# Patient Record
Sex: Male | Born: 1960 | Race: White | Hispanic: No | Marital: Married | Smoking: Former smoker
Health system: Southern US, Community
[De-identification: ages and names within clinical notes are randomized; demographics above are authoritative.]

## PROBLEM LIST (undated history)

## (undated) DIAGNOSIS — Z720 Tobacco use: Secondary | ICD-10-CM

---

## 2015-09-11 ENCOUNTER — Inpatient Hospital Stay (HOSPITAL_COMMUNITY)
Admission: AD | Admit: 2015-09-11 | Discharge: 2015-09-18 | DRG: 234 | Disposition: A | Discharge status: Home / Self Care | Payer: PRIVATE HEALTH INSURANCE | Source: Other Acute Inpatient Hospital | Attending: Cardiothoracic Surgery | Admitting: Cardiothoracic Surgery

## 2015-09-11 ENCOUNTER — Encounter (HOSPITAL_COMMUNITY)
Admission: AD | Disposition: A | Discharge status: Home / Self Care | Payer: Self-pay | Source: Other Acute Inpatient Hospital | Attending: Cardiothoracic Surgery

## 2015-09-11 ENCOUNTER — Other Ambulatory Visit: Payer: Self-pay

## 2015-09-11 ENCOUNTER — Encounter (HOSPITAL_COMMUNITY): Payer: Self-pay | Admitting: Student

## 2015-09-11 DIAGNOSIS — Z720 Tobacco use: Secondary | ICD-10-CM | POA: Diagnosis not present

## 2015-09-11 DIAGNOSIS — I214 Non-ST elevation (NSTEMI) myocardial infarction: Principal | ICD-10-CM | POA: Diagnosis present

## 2015-09-11 DIAGNOSIS — D62 Acute posthemorrhagic anemia: Secondary | ICD-10-CM | POA: Diagnosis not present

## 2015-09-11 DIAGNOSIS — Z8249 Family history of ischemic heart disease and other diseases of the circulatory system: Secondary | ICD-10-CM | POA: Diagnosis not present

## 2015-09-11 DIAGNOSIS — R7303 Prediabetes: Secondary | ICD-10-CM | POA: Diagnosis present

## 2015-09-11 DIAGNOSIS — Z7982 Long term (current) use of aspirin: Secondary | ICD-10-CM

## 2015-09-11 DIAGNOSIS — E8770 Fluid overload, unspecified: Secondary | ICD-10-CM | POA: Diagnosis not present

## 2015-09-11 DIAGNOSIS — I2510 Atherosclerotic heart disease of native coronary artery without angina pectoris: Secondary | ICD-10-CM

## 2015-09-11 DIAGNOSIS — J939 Pneumothorax, unspecified: Secondary | ICD-10-CM

## 2015-09-11 DIAGNOSIS — Z01810 Encounter for preprocedural cardiovascular examination: Secondary | ICD-10-CM

## 2015-09-11 DIAGNOSIS — Z09 Encounter for follow-up examination after completed treatment for conditions other than malignant neoplasm: Secondary | ICD-10-CM

## 2015-09-11 DIAGNOSIS — F17210 Nicotine dependence, cigarettes, uncomplicated: Secondary | ICD-10-CM | POA: Diagnosis present

## 2015-09-11 DIAGNOSIS — Z419 Encounter for procedure for purposes other than remedying health state, unspecified: Secondary | ICD-10-CM

## 2015-09-11 DIAGNOSIS — I25110 Atherosclerotic heart disease of native coronary artery with unstable angina pectoris: Secondary | ICD-10-CM

## 2015-09-11 DIAGNOSIS — Z951 Presence of aortocoronary bypass graft: Secondary | ICD-10-CM

## 2015-09-11 HISTORY — PX: CARDIAC CATHETERIZATION: SHX172

## 2015-09-11 HISTORY — DX: Tobacco use: Z72.0

## 2015-09-11 LAB — MRSA PCR SCREENING: MRSA by PCR: NEGATIVE

## 2015-09-11 LAB — TROPONIN I
TROPONIN I: 25.13 ng/mL — AB (ref <0.031)
TROPONIN I: 22.18 ng/mL — AB (ref <0.031)

## 2015-09-11 SURGERY — LEFT HEART CATH AND CORONARY ANGIOGRAPHY
Anesthesia: LOCAL

## 2015-09-11 MED ORDER — ATORVASTATIN CALCIUM 80 MG PO TABS
80.0000 mg | ORAL_TABLET | Freq: Every day | ORAL | Status: DC
  Administered 2015-09-11 – 2015-09-17 (×6): 80 mg via ORAL
  Filled 2015-09-11 (×6): qty 1

## 2015-09-11 MED ORDER — LIDOCAINE HCL (PF) 1 % IJ SOLN
INTRAMUSCULAR | Status: DC | PRN
  Administered 2015-09-11: 3 mL

## 2015-09-11 MED ORDER — IOPAMIDOL (ISOVUE-370) INJECTION 76%
INTRAVENOUS | Status: DC | PRN
  Administered 2015-09-11: 125 mL via INTRAVENOUS

## 2015-09-11 MED ORDER — VERAPAMIL HCL 2.5 MG/ML IV SOLN
INTRAVENOUS | Status: AC
  Filled 2015-09-11: qty 2

## 2015-09-11 MED ORDER — IOPAMIDOL (ISOVUE-370) INJECTION 76%
INTRAVENOUS | Status: AC
  Filled 2015-09-11: qty 100

## 2015-09-11 MED ORDER — HEPARIN SODIUM (PORCINE) 1000 UNIT/ML IJ SOLN
INTRAMUSCULAR | Status: DC | PRN
  Administered 2015-09-11: 4000 [IU] via INTRAVENOUS

## 2015-09-11 MED ORDER — ONDANSETRON HCL 4 MG/2ML IJ SOLN: 4.0000 mg | Freq: Four times a day (QID) | INTRAMUSCULAR | Status: DC | PRN

## 2015-09-11 MED ORDER — SODIUM CHLORIDE 0.9% FLUSH: 3.0000 mL | Freq: Two times a day (BID) | INTRAVENOUS | Status: DC

## 2015-09-11 MED ORDER — OXYCODONE-ACETAMINOPHEN 5-325 MG PO TABS: 1.0000 | ORAL_TABLET | ORAL | Status: DC | PRN

## 2015-09-11 MED ORDER — MIDAZOLAM HCL 2 MG/2ML IJ SOLN
INTRAMUSCULAR | Status: AC
  Filled 2015-09-11: qty 2

## 2015-09-11 MED ORDER — FENTANYL CITRATE (PF) 100 MCG/2ML IJ SOLN
INTRAMUSCULAR | Status: DC | PRN
  Administered 2015-09-11: 25 ug via INTRAVENOUS
  Administered 2015-09-11: 50 ug via INTRAVENOUS

## 2015-09-11 MED ORDER — NITROGLYCERIN 1 MG/10 ML FOR IR/CATH LAB
INTRA_ARTERIAL | Status: AC
  Filled 2015-09-11: qty 10

## 2015-09-11 MED ORDER — ACETAMINOPHEN 325 MG PO TABS: 650.0000 mg | ORAL_TABLET | ORAL | Status: DC | PRN

## 2015-09-11 MED ORDER — HEPARIN (PORCINE) IN NACL 100-0.45 UNIT/ML-% IJ SOLN
1140.0000 [IU]/h | INTRAMUSCULAR | Status: DC
  Administered 2015-09-11: 1150 [IU]/h via INTRAVENOUS

## 2015-09-11 MED ORDER — IOPAMIDOL (ISOVUE-370) INJECTION 76%
INTRAVENOUS | Status: AC
  Filled 2015-09-11: qty 50

## 2015-09-11 MED ORDER — ATORVASTATIN CALCIUM 80 MG PO TABS: 80.0000 mg | ORAL_TABLET | Freq: Every day | ORAL | Status: DC

## 2015-09-11 MED ORDER — HEPARIN SODIUM (PORCINE) 1000 UNIT/ML IJ SOLN
INTRAMUSCULAR | Status: AC
  Filled 2015-09-11: qty 1

## 2015-09-11 MED ORDER — SODIUM CHLORIDE 0.9% FLUSH: 3.0000 mL | INTRAVENOUS | Status: DC | PRN

## 2015-09-11 MED ORDER — FENTANYL CITRATE (PF) 100 MCG/2ML IJ SOLN
INTRAMUSCULAR | Status: AC
  Filled 2015-09-11: qty 2

## 2015-09-11 MED ORDER — NITROGLYCERIN 1 MG/10 ML FOR IR/CATH LAB
INTRA_ARTERIAL | Status: DC | PRN
  Administered 2015-09-11: 200 ug via INTRACORONARY

## 2015-09-11 MED ORDER — HEPARIN (PORCINE) IN NACL 100-0.45 UNIT/ML-% IJ SOLN
1300.0000 [IU]/h | INTRAMUSCULAR | Status: DC
  Administered 2015-09-12: 1050 [IU]/h via INTRAVENOUS
  Administered 2015-09-13: 1300 [IU]/h via INTRAVENOUS
  Filled 2015-09-11 (×3): qty 250

## 2015-09-11 MED ORDER — HEPARIN (PORCINE) IN NACL 2-0.9 UNIT/ML-% IJ SOLN
INTRAMUSCULAR | Status: DC | PRN
  Administered 2015-09-11: 1000 mL

## 2015-09-11 MED ORDER — SODIUM CHLORIDE 0.9 % IV SOLN
250.0000 mL | INTRAVENOUS | Status: DC | PRN
  Administered 2015-09-14: 12:00:00 via INTRAVENOUS

## 2015-09-11 MED ORDER — ASPIRIN 81 MG PO CHEW
81.0000 mg | CHEWABLE_TABLET | Freq: Every day | ORAL | Status: DC
  Administered 2015-09-11 – 2015-09-13 (×3): 81 mg via ORAL
  Filled 2015-09-11 (×3): qty 1

## 2015-09-11 MED ORDER — SODIUM CHLORIDE 0.9 % WEIGHT BASED INFUSION: 1.0000 mL/kg/h | INTRAVENOUS | Status: DC

## 2015-09-11 MED ORDER — LIDOCAINE HCL (PF) 1 % IJ SOLN
INTRAMUSCULAR | Status: AC
  Filled 2015-09-11: qty 30

## 2015-09-11 MED ORDER — SODIUM CHLORIDE 0.9 % WEIGHT BASED INFUSION
3.0000 mL/kg/h | INTRAVENOUS | Status: DC
  Administered 2015-09-11: 3 mL/kg/h via INTRAVENOUS

## 2015-09-11 MED ORDER — VERAPAMIL HCL 2.5 MG/ML IV SOLN
INTRAVENOUS | Status: DC | PRN
  Administered 2015-09-11: 16:00:00 via INTRA_ARTERIAL

## 2015-09-11 MED ORDER — SODIUM CHLORIDE 0.9 % IV SOLN: 250.0000 mL | INTRAVENOUS | Status: DC | PRN

## 2015-09-11 MED ORDER — HEPARIN (PORCINE) IN NACL 2-0.9 UNIT/ML-% IJ SOLN
INTRAMUSCULAR | Status: AC
  Filled 2015-09-11: qty 1000

## 2015-09-11 MED ORDER — NITROGLYCERIN 0.4 MG SL SUBL: 0.4000 mg | SUBLINGUAL_TABLET | SUBLINGUAL | Status: DC | PRN

## 2015-09-11 MED ORDER — ASPIRIN EC 81 MG PO TBEC: 81.0000 mg | DELAYED_RELEASE_TABLET | Freq: Every day | ORAL | Status: DC

## 2015-09-11 MED ORDER — SODIUM CHLORIDE 0.9 % WEIGHT BASED INFUSION
1.0000 mL/kg/h | INTRAVENOUS | Status: AC
  Administered 2015-09-11: 1 mL/kg/h via INTRAVENOUS

## 2015-09-11 MED ORDER — MIDAZOLAM HCL 2 MG/2ML IJ SOLN
INTRAMUSCULAR | Status: DC | PRN
  Administered 2015-09-11 (×2): 1 mg via INTRAVENOUS

## 2015-09-11 SURGICAL SUPPLY — 12 items
CATH INFINITI 5 FR JL3.5 (CATHETERS) ×2 IMPLANT
CATH INFINITI JR4 5F (CATHETERS) ×2 IMPLANT
CATH LAUNCHER 5F EBU3.5 (CATHETERS) ×2 IMPLANT
CATH LAUNCHER 5F JL3 (CATHETERS) ×1 IMPLANT
CATHETER LAUNCHER 5F JL3 (CATHETERS) ×2
DEVICE RAD COMP TR BAND LRG (VASCULAR PRODUCTS) ×2 IMPLANT
GLIDESHEATH SLEND A-KIT 6F 22G (SHEATH) ×2 IMPLANT
KIT HEART LEFT (KITS) ×2 IMPLANT
PACK CARDIAC CATHETERIZATION (CUSTOM PROCEDURE TRAY) ×2 IMPLANT
TRANSDUCER W/STOPCOCK (MISCELLANEOUS) ×2 IMPLANT
TUBING CIL FLEX 10 FLL-RA (TUBING) ×2 IMPLANT
WIRE SAFE-T 1.5MM-J .035X260CM (WIRE) ×2 IMPLANT

## 2015-09-11 NOTE — Progress Notes (Signed)
ANTICOAGULATION CONSULT NOTE - Follow Up Consult  Pharmacy Consult for Heparin Indication: chest pain/ACS and CAD  Not on File  Patient Measurements: Height: 5\' 6"  (167.6 cm) Weight: 146 lb (66.225 kg) IBW/kg (Calculated) : 63.8 Heparin Dosing Weight: 66.2 kg  Vital Signs: Temp: 99 F (37.2 C) (06/23 1740) Temp Source: Oral (06/23 1740) BP: 96/67 mmHg (06/23 1810) Pulse Rate: 55 (06/23 1810)  Labs:  Recent Labs  09/11/15 1333  TROPONINI 25.13*   Assessment:  s/p cardiac cath.  Heparin drip to resume 8 hrs after sheath out.  Sheath out ~5pm.  TR band deflation in process.  No bleeding or hematoma reported.  For TCTS consult.  Was on IV heparin pre-cath, but went to Cath Lab before first heparin level was scheduled to be drawn.  Goal of Therapy:  Heparin level 0.3-0.7 units/ml Monitor platelets by anticoagulation protocol: Yes   Plan:   Resume heparin drip at 1am on 6/24 at 1050 units/hr (~16 units/kg/hr)  Heparin level ~ 6hrs after drip resumes -> 7am on 6/24.  Daily heparin level and CBC while on heparin.  Dennie FettersEgan, Emslee Lopezmartinez Donovan, [place] Pager: [phone] 09/11/2015,7:10 PM

## 2015-09-11 NOTE — Progress Notes (Signed)
TCTS Please call wife with update/plan as she has to go home. Thank you

## 2015-09-11 NOTE — Consult Note (Signed)
[address]       [place] [address]             [phone]        AYOUB AREY [facility] Medical Record #[id number] Date of Birth: 19-Mar-1961  Referring: Dr Katrinka Blazing, admitted by Dr Swaziland Primary Care: none  Chief Complaint:  Chest Pain   History of Present Illness:     Adam Vega is a 55 y.o. male  who presented to [facility] for evaluation of chest pain and was found to have an elevated troponin of 1.63. Was transferred to [facility] for further evaluation.  Patient notes  intermittent episodes of chest discomfort for the past 2 weeks, lasting for several minutes at a time. He describes the pain as a pressure radiating across his chest and associated with mild dyspnea. No  nausea, vomiting, or diaphoresis.  Yesterday, he reported h passing out for several seconds. No prodromal symptoms noted.   He had recurrent chest pressure this AM and went to [facility] ER  . He has not been seen by a medical provider in years. Is unaware of his current medical diagnoses. Reports smoking 1.5 ppd for 20+ years. He also has a brother who required CABG in his 74's.   While at [facility], labs showed a troponin of 1.63. EKG showed sinus bradycardia with HR of 45 and inferior and lateral TWI. No prior tracings available for comparison. Labs showed a Na+ 137, K+ 4.2, creatinine 1.03, AST 122, ALT 35, INR 0.9. WBC elevated to 12.9, Hgb 14.0, platelets 245.   Current Activity/ Functional Status: Patient is independent with mobility/ambulation, transfers, ADL's, IADL's.   Zubrod Score: At the time of surgery this patient's most appropriate activity status/level should be described as:     0    Normal activity, no symptoms     1    Restricted in physical strenuous activity but ambulatory, able to do out light work     2    Ambulatory and capable of self care, unable to do work activities, up and about                 more than 50%  Of  the time                                3    Only limited self care, in bed greater than 50% of waking hours     4    Completely disabled, no self care, confined to bed or chair     5    Moribund  Past Medical History  Diagnosis Date  . Tobacco user     No past surgical history on file.  History  Smoking status  . Current Every Day Smoker -- 1.50 packs/day for 30 years  . Types: Cigarettes  Smokeless tobacco  . Not on file    History  Alcohol Use: does not drink Not on file    Social History   Social History  . Marital Status: Married    Spouse Name: N/A  . Number of Children: N/A  . Years of Education: N/A   Occupational History  . Plumbing business    Social History Main Topics  . Smoking status: Current Every Day Smoker -- 1.50 packs/day for 30 years    Types: Cigarettes  . Smokeless tobacco: Not on file  .  Alcohol Use: Not on file  . Drug Use: Not on file  . Sexual Activity: Not on file            Not on File  Current Facility-Administered Medications  Medication Dose Route Frequency Provider Last Rate Last Dose  . 0.9 %  sodium chloride infusion  250 mL Intravenous PRN Lyn RecordsHenry W Smith, MD      . 0.9% sodium chloride infusion  1 mL/kg/hr Intravenous Continuous Lyn RecordsHenry W Smith, MD 66.2 mL/hr at 09/11/15 1820 1 mL/kg/hr at 09/11/15 1820  . acetaminophen (TYLENOL) tablet 650 mg  650 mg Oral Q4H PRN Ellsworth LennoxBrittany M Strader, PA      . aspirin chewable tablet 81 mg  81 mg Oral Daily Lyn RecordsHenry W Smith, MD   81 mg at 09/11/15 1820  . atorvastatin (LIPITOR) tablet 80 mg  80 mg Oral q1800 Ellsworth LennoxBrittany M Strader, [place]   80 mg at 09/11/15 1819  . [START ON 09/12/2015] heparin ADULT infusion 100 units/mL (25000 units/24950mL sodium chloride 0.45%)  1,050 Units/hr Intravenous Continuous Scarlett Prestoheresa D Egan, RPH      . nitroGLYCERIN (NITROSTAT) SL tablet 0.4 mg  0.4 mg Sublingual Q5 Min x 3 PRN Ellsworth LennoxBrittany M Strader, [place]      . ondansetron [facility](ZOFRAN) injection 4 mg  4 mg Intravenous Q6H PRN  Ellsworth LennoxBrittany M Strader, [place]      . oxyCODONE-acetaminophen (PERCOCET/ROXICET) 5-325 MG per tablet 1-2 tablet  1-2 tablet Oral Q4H PRN Lyn RecordsHenry W Smith, MD      . sodium chloride flush (NS) 0.9 % injection 3 mL  3 mL Intravenous Q12H Lyn RecordsHenry W Smith, MD      . sodium chloride flush (NS) 0.9 % injection 3 mL  3 mL Intravenous PRN Lyn RecordsHenry W Smith, MD        No prescriptions prior to admission    Family History  Problem Relation Age of Onset  . Heart attack Brother     a. CABG in his 5650's   I did CABG on his brother 10 years ago   Review of Systems:      Cardiac Review of Systems: Y or N  Chest Pain [   y ]  Resting SOB [ n  ] Exertional SOB  [  y]  Orthopnea [  ]   Pedal Edema [   ]    Palpitations [  ] Syncope  [  ]   Presyncope [   ]  General Review of Systems: [Y] = yes [  ]=no Constitional: recent weight change [  ]; anorexia [  ]; fatigue [  ]; nausea [  ]; night sweats [  ]; fever [  ]; or chills [  ]                                                               Dental: poor dentition[  ]; Last Dentist visit:   Eye : blurred vision [  ]; diplopia [   ]; vision changes [  ];  Amaurosis fugax[  ]; Resp: cough [  ];  wheezing[  ];  hemoptysis[  ]; shortness of breath[  ]; paroxysmal nocturnal dyspnea[  ]; dyspnea on exertion[  ]; or orthopnea[  ];  GI:  gallstones[  ], vomiting[  ];  dysphagia[  ]; melena[  ];  hematochezia [  ]; heartburn[  ];   Hx of  Colonoscopy[  ]; GU: kidney stones [  ]; hematuria[  ];   dysuria [  ];  nocturia[  ];  history of     obstruction [  ]; urinary frequency [  ]             Skin: rash, swelling[  ];, hair loss[  ];  peripheral edema[  ];  or itching[  ]; Musculosketetal: myalgias[  ];  joint swelling[  ];  joint erythema[  ];  joint pain[  ];  back pain[  ];  Heme/Lymph: bruising[  ];  bleeding[  ];  anemia[  ];  Neuro: TIA[  ];  headaches[  ];  stroke[  ];  vertigo[  ];  seizures[  ];   paresthesias[  ];  difficulty walking[ n ];  Psych:depression[  ];  anxiety[  ];  Endocrine: diabetes[  ];  thyroid dysfunction[  ];  Immunizations: Flu [ n ]; Pneumococcal[n  ];  Other:  Physical Exam: BP 96/67 mmHg  Pulse 55  Temp(Src) 99 F (37.2 C) (Oral)  Resp 20  Ht 5\' 6"  (1.676 m)  Wt 146 lb (66.225 kg)  BMI 23.58 kg/m2  SpO2 97%   General appearance: alert, cooperative, appears stated age and no distress Head: Normocephalic, without obvious abnormality, atraumatic Neck: no adenopathy, no carotid bruit, no JVD, supple, symmetrical, trachea midline and thyroid not enlarged, symmetric, no tenderness/mass/nodules Lymph nodes: Cervical, supraclavicular, and axillary nodes normal. Resp: clear to auscultation bilaterally Back: symmetric, no curvature. ROM normal. No CVA tenderness. Cardio: regular rate and rhythm, S1, S2 normal, no murmur, click, rub or gallop GI: soft, non-tender; bowel sounds normal; no masses,  no organomegaly Extremities: extremities normal, atraumatic, no cyanosis or edema and Homans sign is negative, no sign of DVT Neurologic: Grossly normal Vein adequate for bypass   Diagnostic Studies & Laboratory data:     Recent Radiology Findings:   No results found.   I have independently reviewed the above radiologic studies.  Recent Lab Findings: No results found for: WBC, HGB, HCT, PLT, GLUCOSE, CHOL, TRIG, HDL, LDLDIRECT, LDLCALC, ALT, AST, NA, K, CL, CREATININE, BUN, CO2, TSH, INR, GLUF, HGBA1C  Lab Results  Component Value Date   TROPONINI 25.13* 09/11/2015    Cath: Coronary Findings    Dominance: Right   Left Anterior Descending  Dist LAD filled by collaterals from Lat Ramus.   . Prox LAD lesion, 100% stenosed.   . First Diagonal Branch   The vessel is small in size.   . First Septal Branch   The vessel is small in size.     Left Circumflex  . Vessel is small.     Right Coronary Artery   . Ost RCA to Prox RCA lesion, 100% stenosed.   . Dist RCA lesion, 100% stenosed.   . Inferior Septal   Inf Sept  filled by collaterals from 1st Sept.   . 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 The total time spent in the appointment was 60 minutes.    Delight Ovens MD      [address] [place] [place] [address] Office [phone]   Beeper [phone]  09/11/2015 8:05 PM

## 2015-09-11 NOTE — Progress Notes (Signed)
CRITICAL VALUE ALERT  Critical value received:  Troponin 25.13  Date of notification:  09/11/2015  Time of notification:  1420  Critical value read back:Yes.    Nurse who received alert:  Glade LloydMelissa Snow, RN  MD notified (1st page):  Randall AnBrittany Strader, [place]  Time of first page:  [phone]   Responding MD:  Randall AnBrittany Strader, PA  Time MD responded:  [phone]

## 2015-09-11 NOTE — Progress Notes (Signed)
Notified by lab of Troponin 25.13. EKG obtained.  Randall AnBrittany Strader, PA notified of critical troponin. Pt states that he has no active chest pain. Will continue to monitor closely, no new orders obtained.

## 2015-09-11 NOTE — Interval H&P Note (Signed)
Cath Lab Visit (complete for each Cath Lab visit)  Clinical Evaluation Leading to the Procedure:   ACS: Yes.    Non-ACS:    Anginal Classification: CCS III  Anti-ischemic medical therapy: Maximal Therapy (2 or more classes of medications)  Non-Invasive Test Results: No non-invasive testing performed  Prior CABG: No previous CABG      History and Physical Interval Note:  09/11/2015 4:16 PM  Adam Vega  has presented today for surgery, with the diagnosis of CP  The various methods of treatment have been discussed with the patient and family. After consideration of risks, benefits and other options for treatment, the patient has consented to  Procedure(s): Left Heart Cath and Coronary Angiography (N/A) as a surgical intervention .  The patient's history has been reviewed, patient examined, no change in status, stable for surgery.  I have reviewed the patient's chart and labs.  Questions were answered to the patient's satisfaction.     Lyn RecordsHenry W Smith III

## 2015-09-11 NOTE — H&P (Signed)
History & Physical    Patient ID: Adam Vega MRN: [id number], DOB/AGE: [birth year]   Admit date: 09/11/2015  Primary Physician: No primary care provider on file. Primary Cardiologist: New - [place]  History of Present Illness    Adam Vega is a 55 y.o. male with past medical history of tobacco abuse who presented [facility] for [facility] of chest pain and was found to have an elevated troponin of 1.63. [facility].  In talking with the patient, he reports intermittent episodes of chest discomfort for the past 2 weeks, lasting for several minutes at a time. He describes the pain as a pressure radiating across his chest and associated with mild dyspnea. Denies any nausea, vomiting, or diaphoresis.  Yesterday, he reported having a mild syncopal event and passing out for several seconds. No prodromal symptoms noted. He did check his BP later that day and noted his SBP was in the low-100's and HR was in the 50's, which he says is normal for him.  He developed recurrent chest pressure this AM and that prompted him to seek evaluation. He has not been seen by a medical provider in years. Is unaware of his current medical diagnoses. Reports smoking 1.5 ppd for 20+ years. He also has a brother who required CABG in his 7550's.   While [facility], labs showed a troponin of 1.63. EKG showed sinus bradycardia with HR of 45 and inferior and lateral TWI. No prior tracings available for comparison. Labs showed a Na+ 137, K+ 4.2, creatinine 1.03, AST 122, ALT 35, INR 0.9. WBC elevated to 12.9, Hgb 14.0, platelets 245.  Past Medical History    Past Medical History  Diagnosis Date  . Tobacco user     No past surgical history on file. - No Prior Procedures.  Allergies  Allergies not on file   Home Medications    Prior to Admission medications   Not on File    Family History    Family  History  Problem Relation Age of Onset  . Heart attack Brother     a. CABG in his 6650's    Social History    Social History   Social History  . Marital Status: Married    Spouse Name: N/A  . Number of Children: N/A  . Years of Education: N/A   Occupational History  . Not on file.   Social History Main Topics  . Smoking status: Current Every Day Smoker -- 1.50 packs/day for 30 years    Types: Cigarettes  . Smokeless tobacco: Not on file  . Alcohol Use: Not on file  . Drug Use: Not on file  . Sexual Activity: Not on file   Other Topics Concern  . Not on file   Social History Narrative  . No narrative on file     Review of Systems    General:  No chills, fever, night sweats or weight changes.  Cardiovascular:  No edema, orthopnea, palpitations, paroxysmal nocturnal dyspnea. Positive for chest pain and dyspnea on exertion. Dermatological: No rash, lesions/masses Respiratory: No cough, dyspnea Urologic: No hematuria, dysuria Abdominal:   No nausea, vomiting, diarrhea, bright red blood per rectum, melena, or hematemesis Neurologic:  No visual changes, wkns, changes in mental status. All other systems reviewed and are otherwise negative except as noted above.  Physical Exam    There were no vitals taken for this visit.  General: Well developed, well nourished,male in no acute distress. Head: Normocephalic, atraumatic, sclera non-icteric, no xanthomas, nares are without discharge. Dentition:  Neck: No carotid bruits. JVD not elevated.  Lungs: Respirations regular and unlabored, without wheezes or rales.  Heart: Regular rate and rhythm. No S3 or S4.  No murmur, no rubs, or gallops appreciated. Abdomen: Soft, non-tender, non-distended with normoactive bowel sounds. No hepatomegaly. No rebound/guarding. No obvious abdominal masses. Msk:  Strength and tone appear normal for age. No joint deformities or effusions. Extremities: No clubbing or cyanosis. No edema.  Distal pedal  pulses are 2+ bilaterally. Neuro: Alert and oriented X 3. Moves all extremities spontaneously. No focal deficits noted. Psych:  Responds to questions appropriately with a normal affect. Skin: No rashes or lesions noted  Labs    Labs at [facility]:   Troponin: 1.63.  Na+ 137 K+ 4.2 Creatinine 1.03 AST 122 ALT 35 INR 0.9 WBC 12.9 Hgb 14.0 Platelets 245  Other labs values are in his shadow chart.   Radiology Studies    No results found.  EKG & Cardiac Imaging    EKG: Sinus bradycardia with HR of 45 and inferior and lateral TWI. No prior tracings available for comparison.  ECHOCARDIOGRAM: None on File  Assessment & Plan    1. NSTEMI (non-ST elevated myocardial infarction) [facility](HCC) - presented [facility] for evaluation of chest pain, occurring for the past 2 weeks and worse with exertion. Noted a brief syncopal event yesterday. - no personal history of CAD, HTN, HLD, or Type 2 DM, but he has not seen a medical provider in 10+ years. Will check Lipid Panel and A1c for risk stratification. Known risks include his tobacco use and family history of CAD.  - initial troponin elevated to 1.63 and EKG showing inferior and lateral TWI. No prior tracings available for comparison. Will cycle troponin values. - recommend a cardiac catheterization. The patient understands that risks included but are not limited to stroke (1 in 1000), death (1 in 1000), kidney failure [usually temporary] (1 in 500), bleeding (1 in 200), allergic reaction [possibly serious] (1 in 200). Continue Heparin. Has been added to the catheterization schedule for later today. - start high-intensity statin. No BB currently secondary to bradycardia.  2. Tobacco abuse - cessation advised.   Signed, Ellsworth LennoxBrittany M Strader, PA-C 09/11/2015, 1:15 PM Pager: [phone]  Patient seen and examined and history reviewed. Agree with above findings and plan. 55 yo WM with history of tobacco abuse and family history  of premature CAD presents with 2 week history of chest pain. He had a near syncopal episode yesterday. No prior cardiac or medical history.  [facility] Ecg showed T wave inversion in the inferolateral leads. Troponin elevated at 1.6. Currently he is pain free. Exam is unremarkable. Impression:  NSTEMI. Start ASA and statin. Hold beta blocker due to bradycardia. Plan invasive evaluation with cardiac cath today. Strongly advise smoking cessation. Follow up lipid panel.  Peter SwazilandJordan, MDFACC 09/11/2015 1:33 PM

## 2015-09-11 NOTE — Progress Notes (Signed)
ANTICOAGULATION CONSULT NOTE - Initial Consult  Pharmacy Consult for Heparin Indication: chest pain/ACS  Not on File  Patient Measurements: IBW: 63.8 kg Heparin Dosing Weight: 63.5 kg  Vital Signs:    Labs: No results for input(s): HGB, HCT, PLT, APTT, LABPROT, INR, HEPARINUNFRC, HEPRLOWMOCWT, CREATININE, CKTOTAL, CKMB, TROPONINI in the last 72 hours.  CrCl cannot be calculated (Unknown ideal weight.).   Medical History: Past Medical History  Diagnosis Date  . Tobacco user     Medications:  F/u med rec. [facility]  Assessment: [age] y/o M [facility] with CP and elevated troponin. Yesterday, he reported having a mild syncopal event and passing out for several seconds at work. EKG showed sinus bradycardia with HR of 45 and inferior and lateral TWI  [facility] labs: Na+ 137, K+ 4.2, creatinine 1.03, AST 122, ALT 35, INR 0.9. WBC elevated to 12.9, Hgb 14.0, platelets 245.  PMH: tobacco use, no MD in 10+ years.+FH  premature CAD.  Anticoagulation: Start IV heparin for NSTEMI and plan cath. Baseline CBC WNL.  Goal of Therapy:  Heparin level 0.3-0.7 units/ml Monitor platelets by anticoagulation protocol: Yes   Plan:  Heparin 4000 unit [facility] bolus [facility] 0942 Heparin infusion 1140 units[facility]hr [facility] (18 units/kg/hr) Heparin level this afternoon. Daily HL and CBC  Commodore Bellew S. Merilynn Finlandobertson, PharmD, BCPS Clinical Staff Pharmacist Pager [phone]  Misty Stanleyobertson, Namir Neto Stillinger 09/11/2015,1:28 PM

## 2015-09-12 ENCOUNTER — Observation Stay (HOSPITAL_COMMUNITY): Payer: PRIVATE HEALTH INSURANCE

## 2015-09-12 DIAGNOSIS — Z8249 Family history of ischemic heart disease and other diseases of the circulatory system: Secondary | ICD-10-CM | POA: Diagnosis not present

## 2015-09-12 DIAGNOSIS — I214 Non-ST elevation (NSTEMI) myocardial infarction: Secondary | ICD-10-CM

## 2015-09-12 DIAGNOSIS — E8770 Fluid overload, unspecified: Secondary | ICD-10-CM | POA: Diagnosis not present

## 2015-09-12 DIAGNOSIS — F17210 Nicotine dependence, cigarettes, uncomplicated: Secondary | ICD-10-CM | POA: Diagnosis present

## 2015-09-12 DIAGNOSIS — I2510 Atherosclerotic heart disease of native coronary artery without angina pectoris: Secondary | ICD-10-CM | POA: Diagnosis present

## 2015-09-12 DIAGNOSIS — R7303 Prediabetes: Secondary | ICD-10-CM | POA: Diagnosis present

## 2015-09-12 DIAGNOSIS — Z7982 Long term (current) use of aspirin: Secondary | ICD-10-CM | POA: Diagnosis not present

## 2015-09-12 DIAGNOSIS — D62 Acute posthemorrhagic anemia: Secondary | ICD-10-CM | POA: Diagnosis not present

## 2015-09-12 DIAGNOSIS — I25110 Atherosclerotic heart disease of native coronary artery with unstable angina pectoris: Secondary | ICD-10-CM | POA: Diagnosis not present

## 2015-09-12 LAB — CBC
HCT: 37.6 % — ABNORMAL LOW (ref 39.0–52.0)
HEMOGLOBIN: 12.3 g/dL — AB (ref 13.0–17.0)
MCH: 28 pg (ref 26.0–34.0)
MCHC: 32.7 g/dL (ref 30.0–36.0)
MCV: 85.6 fL (ref 78.0–100.0)
Platelets: 183 10*3/uL (ref 150–400)
RBC: 4.39 MIL/uL (ref 4.22–5.81)
RDW: 13.7 % (ref 11.5–15.5)
WBC: 9.3 10*3/uL (ref 4.0–10.5)

## 2015-09-12 LAB — LIPID PANEL
CHOLESTEROL: 140 mg/dL (ref 0–200)
HDL: 28 mg/dL — AB (ref >40)
LDL Cholesterol: 86 mg/dL (ref 0–99)
TRIGLYCERIDES: 128 mg/dL (ref <150)
Total CHOL/HDL Ratio: 5 RATIO
VLDL: 26 mg/dL (ref 0–40)

## 2015-09-12 LAB — COMPREHENSIVE METABOLIC PANEL
ALK PHOS: 41 U/L (ref 38–126)
ALT: 35 U/L (ref 17–63)
ANION GAP: 5 (ref 5–15)
AST: 107 U/L — ABNORMAL HIGH (ref 15–41)
Albumin: 3.3 g/dL — ABNORMAL LOW (ref 3.5–5.0)
BILIRUBIN TOTAL: 0.8 mg/dL (ref 0.3–1.2)
BUN: 8 mg/dL (ref 6–20)
CALCIUM: 8.8 mg/dL — AB (ref 8.9–10.3)
CO2: 26 mmol/L (ref 22–32)
Chloride: 105 mmol/L (ref 101–111)
Creatinine, Ser: 0.97 mg/dL (ref 0.61–1.24)
Glucose, Bld: 101 mg/dL — ABNORMAL HIGH (ref 65–99)
Potassium: 3.9 mmol/L (ref 3.5–5.1)
SODIUM: 136 mmol/L (ref 135–145)
TOTAL PROTEIN: 5.3 g/dL — AB (ref 6.5–8.1)

## 2015-09-12 LAB — HEPARIN LEVEL (UNFRACTIONATED)
Heparin Unfractionated: 0.22 IU/mL — ABNORMAL LOW (ref 0.30–0.70)
Heparin Unfractionated: 0.46 IU/mL (ref 0.30–0.70)
Heparin Unfractionated: 0.29 IU/mL — ABNORMAL LOW (ref 0.30–0.70)

## 2015-09-12 LAB — TROPONIN I: Troponin I: 22.73 ng/mL (ref <0.031)

## 2015-09-12 MED ORDER — TRAZODONE HCL 50 MG PO TABS
25.0000 mg | ORAL_TABLET | Freq: Once | ORAL | Status: AC
  Administered 2015-09-12: 25 mg via ORAL
  Filled 2015-09-12: qty 1

## 2015-09-12 MED ORDER — NICOTINE 14 MG/24HR TD PT24
14.0000 mg | MEDICATED_PATCH | TRANSDERMAL | Status: DC
  Administered 2015-09-12 – 2015-09-13 (×2): 14 mg via TRANSDERMAL
  Filled 2015-09-12 (×2): qty 1

## 2015-09-12 NOTE — Progress Notes (Signed)
Pre-op Cardiac Surgery  Carotid Findings:  1-39% ICA plaquing.  Vertebral artery flow is antegrade.   Upper Extremity Right Left  Brachial Pressures 108T 104T  Radial Waveforms T T  Ulnar Waveforms T T  Palmar Arch (Allen's Test) Doppler signal remains normal with radial compression and obliterate with ulnar WNL   Findings:      Lower  Extremity Right Left  Dorsalis Pedis    Anterior Tibial T T  Posterior Tibial T T  Ankle/Brachial Indices      Findings:

## 2015-09-12 NOTE — Progress Notes (Signed)
ANTICOAGULATION CONSULT NOTE - Follow Up Consult  Pharmacy Consult for Heparin Indication: chest pain/ACS and CAD  Not on File  Patient Measurements: Height: 5\' 6"  (167.6 cm) Weight: 146 lb (66.225 kg) IBW/kg (Calculated) : 63.8 Heparin Dosing Weight: 66.2 kg  Vital Signs: Temp: 98.2 F (36.8 C) (06/24 0816) Temp Source: Oral (06/24 0816) BP: 89/58 mmHg (06/24 0500) Pulse Rate: 54 (06/24 0816)  Labs:  Recent Labs  09/11/15 1333 09/11/15 1926 09/12/15 0117 09/12/15 0656  HGB  --   --  12.3*  --   HCT  --   --  37.6*  --   PLT  --   --  183  --   HEPARINUNFRC  --   --   --  0.22*  CREATININE  --   --  0.97  --   TROPONINI 25.13* 22.18* 22.73*  --    Assessment: Heparin for NSTEMI resumed post-cath 6/24 0120. 6hr HL subtherapeutic at 0.22 on 1050 unit/hr. H/H 12.3/37.6, plt 183. No overt bleeding noted.   Goal of Therapy:  Heparin level 0.3-0.7 units/ml Monitor platelets by anticoagulation protocol: Yes   Plan:  -Increase heparin to 1200 units/hr -6hr HL -Daily HL, CBC -Monitor s/sx bleeding  Sherle Poeob Benjerman Molinelli, PharmD Clinical Pharmacy Resident 8:24 AM, 09/12/2015

## 2015-09-12 NOTE — Progress Notes (Signed)
ANTICOAGULATION CONSULT NOTE - Follow Up Consult  Pharmacy Consult for Heparin Indication: chest pain/ACS and CAD  Allergies  Allergen Reactions  . Amoxicillin Other (See Comments)    Patient just stated that he got very sick .   Marland Kitchen. Penicillins Other (See Comments)    Patient just stated that he got very sick    Patient Measurements: Height: 5\' 6"  (167.6 cm) Weight: 146 lb (66.225 kg) IBW/kg (Calculated) : 63.8 Heparin Dosing Weight: 66.2 kg  Vital Signs: Temp: 98.7 F (37.1 C) (06/24 1342) Temp Source: Oral (06/24 1342) BP: 95/53 mmHg (06/24 1342) Pulse Rate: 59 (06/24 1342)  Labs:  Recent Labs  09/11/15 1333 09/11/15 1926 09/12/15 0117 09/12/15 0656 09/12/15 1441 09/12/15 2007  HGB  --   --  12.3*  --   --   --   HCT  --   --  37.6*  --   --   --   PLT  --   --  183  --   --   --   HEPARINUNFRC  --   --   --  0.22* 0.46 0.29*  CREATININE  --   --  0.97  --   --   --   TROPONINI 25.13* 22.18* 22.73*  --   --   --    Assessment: Heparin for NSTEMI resumed post-cath 6/24 0120. H/H 12.3/37.6, plt 183. No overt bleeding noted.   Heparin level now slightly sub therapeutic at 0.29 after being therapeutic earlier today. No issues with infusion or sxs of bleeding.   Goal of Therapy:  Heparin level 0.3-0.7 units/ml Monitor platelets by anticoagulation protocol: Yes   Plan:  - Increase heparin to 1300 units/hr - HL in AM as already ordered.   Pollyann SamplesAndy Audrionna Lampton, PharmD, BCPS 09/12/2015, 8:51 PM Pager: [phone]

## 2015-09-12 NOTE — Progress Notes (Signed)
    Subjective:  Denies SSCP, palpitations or Dyspnea For CABG Monday  Objective:  Filed Vitals:   09/11/15 2225 09/11/15 2325 09/12/15 0026 09/12/15 0500  BP: 107/71 92/62 101/70 89/58  Pulse: 60 52 57 66  Temp:   98.9 F (37.2 C) 99.3 F (37.4 C)  TempSrc:      Resp: 22 24 20 19   Height:      Weight:      SpO2: 94% 95% 98% 94%    Intake/Output from previous day:  Intake/Output Summary (Last 24 hours) at 09/12/15 [id number] Last data filed at 09/12/15 0700  Gross per 24 hour  Intake 1229.62 ml  Output   1525 ml  Net -295.38 ml    Physical Exam: Affect appropriate Healthy:  appears stated age HEENT: normal Neck supple with no adenopathy JVP normal no bruits no thyromegaly Lungs clear with no wheezing and good diaphragmatic motion Heart:  S1/S2 no murmur, no rub, gallop or click PMI normal Abdomen: benighn, BS positve, no tenderness, no AAA no bruit.  No HSM or HJR Distal pulses intact with no bruits No edema Neuro non-focal Skin warm and dry No muscular weakness   Lab Results: Basic Metabolic Panel:  Recent Labs  11/91/4706/24/17 0117  NA 136  K 3.9  CL 105  CO2 26  GLUCOSE 101*  BUN 8  CREATININE 0.97  CALCIUM 8.8*   Liver Function Tests:  Recent Labs  09/12/15 0117  AST 107*  ALT 35  ALKPHOS 41  BILITOT 0.8  PROT 5.3*  ALBUMIN 3.3*   CBC:  Recent Labs  09/12/15 0117  WBC 9.3  HGB 12.3*  HCT 37.6*  MCV 85.6  PLT 183   Cardiac Enzymes:  Recent Labs  09/11/15 1333 09/11/15 1926 09/12/15 0117  TROPONINI 25.13* 22.18* 22.73*   Fasting Lipid Panel:  Recent Labs  09/12/15 0117  CHOL 140  HDL 28*  LDLCALC 86  TRIG 829128  CHOLHDL 5.0    Imaging: No results found.  Cardiac Studies:  ECG:  SR inferolateral T wave inversions    Telemetry:  NSR 09/12/2015   Echo:   Medications:   . aspirin  81 mg Oral Daily  . atorvastatin  80 mg Oral q1800  . sodium chloride flush  3 mL Intravenous Q12H     . heparin 1,050 Units/hr  (09/12/15 0118)    Assessment/Plan:  55 y.o. severe CAD Cath reviewed:  Total occlusion of the proximal RCA. RCA is dominant giving 3 large inferior wall vessels taken up the territory of the distal circumflex. Left right collaterals are noted.  Total occlusion of the proximal to mid LAD beyond the origin of the first elbow perforator. Left left collaterals are noted.  Circumflex is relatively small and widely patent..   The large branching ramus intermedius is widely patent and supplies many of the collaterals to both RCA and LAD.  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 LVEDP is mildly elevated.  Enzymes over 22  Continue heparin for CABG with Dr Tyrone SageGerhardt Monday BP soft not on ACE hopefully EF will improve post revascularization Will need f/u EF 3 months consider AICD  Adam Vega Adam Vega 09/12/2015, 8:11 AM

## 2015-09-12 NOTE — Progress Notes (Signed)
ANTICOAGULATION CONSULT NOTE - Follow Up Consult  Pharmacy Consult for Heparin Indication: chest pain/ACS and CAD  Allergies  Allergen Reactions  . Amoxicillin Other (See Comments)    Patient just stated that he got very sick .   Marland Kitchen. Penicillins Other (See Comments)    Patient just stated that he got very sick    Patient Measurements: Height: 5\' 6"  (167.6 cm) Weight: 146 lb (66.225 kg) IBW/kg (Calculated) : 63.8 Heparin Dosing Weight: 66.2 kg  Vital Signs: Temp: 98.7 F (37.1 C) (06/24 1342) Temp Source: Oral (06/24 1342) BP: 95/53 mmHg (06/24 1342) Pulse Rate: 59 (06/24 1342)  Labs:  Recent Labs  09/11/15 1333 09/11/15 1926 09/12/15 0117 09/12/15 0656 09/12/15 1441  HGB  --   --  12.3*  --   --   HCT  --   --  37.6*  --   --   PLT  --   --  183  --   --   HEPARINUNFRC  --   --   --  0.22* 0.46  CREATININE  --   --  0.97  --   --   TROPONINI 25.13* 22.18* 22.73*  --   --    Assessment: Heparin for NSTEMI resumed post-cath 6/24 0120. H/H 12.3/37.6, plt 183. No overt bleeding noted.   Heparin level now therapeutic @ 0.46. Continue current rate  Goal of Therapy:  Heparin level 0.3-0.7 units/ml Monitor platelets by anticoagulation protocol: Yes   Plan:  -Contine heparin at 1200 units/hr -6hr cHL -Daily HL, CBC -Monitor s/sx bleeding  Eulice Rutledge C. Marvis MoellerMiles, PharmD Pharmacy Resident  Pager: [phone] 09/12/2015 3:49 PM

## 2015-09-13 ENCOUNTER — Inpatient Hospital Stay (HOSPITAL_COMMUNITY): Payer: PRIVATE HEALTH INSURANCE

## 2015-09-13 LAB — BLOOD GAS, ARTERIAL
Acid-Base Excess: 0.7 mmol/L (ref 0.0–2.0)
Bicarbonate: 24.5 mEq/L — ABNORMAL HIGH (ref 20.0–24.0)
Drawn by: 283401
FIO2: 0.21
O2 Saturation: 94.2 %
Patient temperature: 98.6
TCO2: 25.6 mmol/L (ref 0–100)
pCO2 arterial: 37.1 mmHg (ref 35.0–45.0)
pH, Arterial: 7.435 (ref 7.350–7.450)
pO2, Arterial: 72.6 mmHg — ABNORMAL LOW (ref 80.0–100.0)

## 2015-09-13 LAB — CBC
HEMATOCRIT: 40.1 % (ref 39.0–52.0)
HEMOGLOBIN: 12.9 g/dL — AB (ref 13.0–17.0)
MCH: 27.7 pg (ref 26.0–34.0)
MCHC: 32.2 g/dL (ref 30.0–36.0)
MCV: 86.2 fL (ref 78.0–100.0)
PLATELETS: 206 10*3/uL (ref 150–400)
RBC: 4.65 MIL/uL (ref 4.22–5.81)
RDW: 13.7 % (ref 11.5–15.5)
WBC: 11.6 10*3/uL — ABNORMAL HIGH (ref 4.0–10.5)

## 2015-09-13 LAB — URINALYSIS, ROUTINE W REFLEX MICROSCOPIC
Bilirubin Urine: NEGATIVE
Glucose, UA: NEGATIVE mg/dL
Hgb urine dipstick: NEGATIVE
Ketones, ur: NEGATIVE mg/dL
Leukocytes, UA: NEGATIVE
Nitrite: NEGATIVE
Protein, ur: NEGATIVE mg/dL
Specific Gravity, Urine: 1.012 (ref 1.005–1.030)
pH: 7.5 (ref 5.0–8.0)

## 2015-09-13 LAB — TYPE AND SCREEN
ABO/RH(D): O POS
Antibody Screen: NEGATIVE

## 2015-09-13 LAB — ABO/RH: ABO/RH(D): O POS

## 2015-09-13 LAB — PROTIME-INR
INR: 1.1 (ref 0.00–1.49)
Prothrombin Time: 14.4 seconds (ref 11.6–15.2)

## 2015-09-13 LAB — APTT: aPTT: 80 seconds — ABNORMAL HIGH (ref 24–37)

## 2015-09-13 LAB — SURGICAL PCR SCREEN
MRSA, PCR: NEGATIVE
Staphylococcus aureus: NEGATIVE

## 2015-09-13 LAB — HEPARIN LEVEL (UNFRACTIONATED): Heparin Unfractionated: 0.49 IU/mL (ref 0.30–0.70)

## 2015-09-13 MED ORDER — POTASSIUM CHLORIDE 2 MEQ/ML IV SOLN
80.0000 meq | INTRAVENOUS | Status: DC
  Filled 2015-09-13: qty 40

## 2015-09-13 MED ORDER — TEMAZEPAM 15 MG PO CAPS
15.0000 mg | ORAL_CAPSULE | Freq: Once | ORAL | Status: AC | PRN
  Administered 2015-09-13: 15 mg via ORAL
  Filled 2015-09-13: qty 1

## 2015-09-13 MED ORDER — PLASMA-LYTE 148 IV SOLN
INTRAVENOUS | Status: AC
  Administered 2015-09-14: 500 mL
  Filled 2015-09-13: qty 2.5

## 2015-09-13 MED ORDER — EPINEPHRINE HCL 1 MG/ML IJ SOLN
0.0000 ug/min | INTRAVENOUS | Status: DC
  Filled 2015-09-13: qty 4

## 2015-09-13 MED ORDER — SODIUM CHLORIDE 0.9 % IV SOLN
INTRAVENOUS | Status: DC
  Filled 2015-09-13: qty 30

## 2015-09-13 MED ORDER — SODIUM CHLORIDE 0.9 % IV SOLN
INTRAVENOUS | Status: DC
  Filled 2015-09-13: qty 2.5

## 2015-09-13 MED ORDER — MAGNESIUM SULFATE 50 % IJ SOLN
40.0000 meq | INTRAMUSCULAR | Status: DC
  Filled 2015-09-13: qty 10

## 2015-09-13 MED ORDER — DEXMEDETOMIDINE HCL IN NACL 400 MCG/100ML IV SOLN
0.1000 ug/kg/h | INTRAVENOUS | Status: AC
  Administered 2015-09-14: 0.7 ug/kg/h via INTRAVENOUS
  Filled 2015-09-13: qty 100

## 2015-09-13 MED ORDER — DOPAMINE-DEXTROSE 3.2-5 MG/ML-% IV SOLN
0.0000 ug/kg/min | INTRAVENOUS | Status: AC
  Administered 2015-09-14: 3 ug/kg/min via INTRAVENOUS
  Filled 2015-09-13: qty 250

## 2015-09-13 MED ORDER — CHLORHEXIDINE GLUCONATE 4 % EX LIQD
60.0000 mL | Freq: Once | CUTANEOUS | Status: AC
  Administered 2015-09-13: 4 via TOPICAL
  Filled 2015-09-13: qty 60

## 2015-09-13 MED ORDER — CHLORHEXIDINE GLUCONATE 4 % EX LIQD
60.0000 mL | Freq: Once | CUTANEOUS | Status: AC
  Administered 2015-09-14: 4 via TOPICAL
  Filled 2015-09-13: qty 60

## 2015-09-13 MED ORDER — BISACODYL 5 MG PO TBEC
5.0000 mg | DELAYED_RELEASE_TABLET | Freq: Once | ORAL | Status: DC
  Filled 2015-09-13: qty 1

## 2015-09-13 MED ORDER — VANCOMYCIN HCL 10 G IV SOLR
1250.0000 mg | INTRAVENOUS | Status: AC
  Administered 2015-09-14: 1250 mg via INTRAVENOUS
  Filled 2015-09-13: qty 1250

## 2015-09-13 MED ORDER — NITROGLYCERIN IN D5W 200-5 MCG/ML-% IV SOLN
2.0000 ug/min | INTRAVENOUS | Status: AC
  Administered 2015-09-14: 15 ug/min via INTRAVENOUS
  Filled 2015-09-13: qty 250

## 2015-09-13 MED ORDER — SODIUM CHLORIDE 0.9 % IV SOLN
INTRAVENOUS | Status: AC
  Administered 2015-09-14: 69.8 mL/h via INTRAVENOUS
  Filled 2015-09-13: qty 40

## 2015-09-13 MED ORDER — PHENYLEPHRINE HCL 10 MG/ML IJ SOLN
30.0000 ug/min | INTRAVENOUS | Status: AC
  Administered 2015-09-14: 50 ug/min via INTRAVENOUS
  Filled 2015-09-13: qty 2

## 2015-09-13 MED ORDER — LEVOFLOXACIN IN D5W 500 MG/100ML IV SOLN
500.0000 mg | INTRAVENOUS | Status: DC
  Filled 2015-09-13: qty 100

## 2015-09-13 MED ORDER — METOPROLOL TARTRATE 12.5 MG HALF TABLET
12.5000 mg | ORAL_TABLET | Freq: Once | ORAL | Status: AC
  Administered 2015-09-14: 12.5 mg via ORAL
  Filled 2015-09-13: qty 1

## 2015-09-13 MED ORDER — CHLORHEXIDINE GLUCONATE 0.12 % MT SOLN
15.0000 mL | Freq: Once | OROMUCOSAL | Status: AC
  Administered 2015-09-14: 15 mL via OROMUCOSAL
  Filled 2015-09-13: qty 15

## 2015-09-13 NOTE — Progress Notes (Signed)
Patient ID: Virl SonRichard K Anton, male   DOB: November 30, 1960, 55 y.o.   MRN: [id number]    Subjective:  Denies SSCP, palpitations or Dyspnea For CABG Monday  Objective:  Filed Vitals:   09/12/15 0816 09/12/15 1342 09/12/15 2050 09/13/15 0500  BP: 110/70 95/53 102/59 106/67  Pulse: 54 59 60 69  Temp: 98.2 F (36.8 C) 98.7 F (37.1 C) 98.2 F (36.8 C) 99.4 F (37.4 C)  TempSrc: Oral Oral    Resp: 20 15 16 15   Height:      Weight:    143 lb (64.864 kg)  SpO2: 96% 96% 96% 95%    Intake/Output from previous day:  Intake/Output Summary (Last 24 hours) at 09/13/15 0845 Last data filed at 09/13/15 [id number]  Gross per 24 hour  Intake 1392.4 ml  Output   1740 ml  Net -347.6 ml    Physical Exam: Affect appropriate Healthy:  appears stated age HEENT: normal Neck supple with no adenopathy JVP normal no bruits no thyromegaly Lungs clear with no wheezing and good diaphragmatic motion Heart:  S1/S2 no murmur, no rub, gallop or click PMI normal Abdomen: benighn, BS positve, no tenderness, no AAA no bruit.  No HSM or HJR Distal pulses intact with no bruits No edema Neuro non-focal Skin warm and dry No muscular weakness   Lab Results: Basic Metabolic Panel:  Recent Labs  30/86/5706/24/17 0117  NA 136  K 3.9  CL 105  CO2 26  GLUCOSE 101*  BUN 8  CREATININE 0.97  CALCIUM 8.8*   Liver Function Tests:  Recent Labs  09/12/15 0117  AST 107*  ALT 35  ALKPHOS 41  BILITOT 0.8  PROT 5.3*  ALBUMIN 3.3*   CBC:  Recent Labs  09/12/15 0117 09/13/15 0324  WBC 9.3 11.6*  HGB 12.3* 12.9*  HCT 37.6* 40.1  MCV 85.6 86.2  PLT 183 206   Cardiac Enzymes:  Recent Labs  09/11/15 1333 09/11/15 1926 09/12/15 0117  TROPONINI 25.13* 22.18* 22.73*   Fasting Lipid Panel:  Recent Labs  09/12/15 0117  CHOL 140  HDL 28*  LDLCALC 86  TRIG 846128  CHOLHDL 5.0    Imaging: No results found.  Cardiac Studies:  ECG:  SR inferolateral T wave inversions    Telemetry:  NSR  09/13/2015   Echo:   Medications:   . aspirin  81 mg Oral Daily  . atorvastatin  80 mg Oral q1800  . bisacodyl  5 mg Oral Once  . chlorhexidine  60 mL Topical Once   And  . [START ON 09/14/2015] chlorhexidine  60 mL Topical Once  . [START ON 09/14/2015] chlorhexidine  15 mL Mouth/Throat Once  . [START ON 09/14/2015] metoprolol tartrate  12.5 mg Oral Once  . nicotine  14 mg Transdermal Q24H  . sodium chloride flush  3 mL Intravenous Q12H     . heparin 1,300 Units/hr (09/12/15 2133)    Assessment/Plan:  55 y.o. severe CAD Cath reviewed:  Total occlusion of the proximal RCA. RCA is dominant giving 3 large inferior wall vessels taken up the territory of the distal circumflex. Left right collaterals are noted.  Total occlusion of the proximal to mid LAD beyond the origin of the first elbow perforator. Left left collaterals are noted.  Circumflex is relatively small and widely patent..   The large branching ramus intermedius is widely patent and supplies many of the collaterals to both RCA and LAD.  Left ventricular dysfunction, ischemically mediated with LVEF in the 30-35%  range. LVEDP is mildly elevated.  Enzymes over 22  Continue heparin for CABG with Dr Tyrone SageGerhardt Monday BP soft not on ACE hopefully EF will improve post revascularization Will need f/u EF 3 months consider AICD  Charlton Hawseter Evangaline Jou 09/13/2015, 8:45 AM

## 2015-09-13 NOTE — Progress Notes (Signed)
ANTICOAGULATION CONSULT NOTE - Follow Up Consult  Pharmacy Consult for Heparin Indication: chest pain/ACS and CAD  Allergies  Allergen Reactions  . Amoxicillin Other (See Comments)    Patient just stated that he got very sick .   Marland Kitchen. Penicillins Other (See Comments)    Patient just stated that he got very sick    Patient Measurements: Height: 5\' 6"  (167.6 cm) Weight: 143 lb (64.864 kg) IBW/kg (Calculated) : 63.8 Heparin Dosing Weight: 66.2 kg  Vital Signs: Temp: 99.4 F (37.4 C) (06/25 0500) BP: 106/67 mmHg (06/25 0500) Pulse Rate: 69 (06/25 0500)  Labs:  Recent Labs  09/11/15 1333 09/11/15 1926 09/12/15 0117  09/12/15 1441 09/12/15 2007 09/13/15 0324 09/13/15 0325  HGB  --   --  12.3*  --   --   --  12.9*  --   HCT  --   --  37.6*  --   --   --  40.1  --   PLT  --   --  183  --   --   --  206  --   HEPARINUNFRC  --   --   --   < > 0.46 0.29*  --  0.49  CREATININE  --   --  0.97  --   --   --   --   --   TROPONINI 25.13* 22.18* 22.73*  --   --   --   --   --   < > = values in this interval not displayed. Assessment: Heparin for NSTEMI resumed post-cath 6/24 0120.HL remains therapeutic at 0.49 on 1300 units/hr. CBC stable. No overt bleeding reported.  Goal of Therapy:  Heparin level 0.3-0.7 units/ml Monitor platelets by anticoagulation protocol: Yes   Plan:  -Continue heparin at 1300 units/hr - CABG Monday  -Daily HL, CBC -Monitor s/sx bleeding   Sherle Poeob Tilda Samudio, PharmD Clinical Pharmacy Resident 7:34 AM, 09/13/2015

## 2015-09-13 NOTE — Progress Notes (Signed)
Patient ID: Adam Vega, male   DOB: December 20, 1960, 55 y.o.   MRN: [id number]      [address]       [place] [id number]             [phone]                 2 Days Post-Op Procedure(s) (LRB): Left Heart Cath and Coronary Angiography (N/A)  LOS: 2 days   Subjective: No complaints , alert and awake   Objective: Vital signs in last 24 hours: Patient Vitals for the past 24 hrs:  BP Temp Temp src Pulse Resp SpO2 Weight  09/13/15 0500 106/67 mmHg 99.4 F (37.4 C) - 69 15 95 % 143 lb (64.864 kg)  09/12/15 2050 (!) 102/59 mmHg 98.2 F (36.8 C) - 60 16 96 % -  09/12/15 1342 (!) 95/53 mmHg 98.7 F (37.1 C) Oral (!) 59 15 96 % -    Filed Weights   09/11/15 1300 09/11/15 1425 09/13/15 0500  Weight: 140 lb (63.504 kg) 146 lb (66.225 kg) 143 lb (64.864 kg)    Hemodynamic parameters for last 24 hours:    Intake/Output from previous day: 06/24 0701 - 06/25 0700 In: 968.8 [P.O.:840; I.V.:128.8] Out: 1490 [Urine:1490] Intake/Output this shift: Total I/O In: 423.7 [P.O.:240; I.V.:183.7] Out: 250 [Urine:250]  Scheduled Meds: . aspirin  81 mg Oral Daily  . atorvastatin  80 mg Oral q1800  . bisacodyl  5 mg Oral Once  . chlorhexidine  60 mL Topical Once   And  . [START ON 09/14/2015] chlorhexidine  60 mL Topical Once  . [START ON 09/14/2015] chlorhexidine  15 mL Mouth/Throat Once  . [START ON 09/14/2015] metoprolol tartrate  12.5 mg Oral Once  . nicotine  14 mg Transdermal Q24H  . sodium chloride flush  3 mL Intravenous Q12H   Continuous Infusions: . heparin 1,300 Units/hr (09/12/15 2133)   PRN Meds:.sodium chloride, acetaminophen, nitroGLYCERIN, ondansetron (ZOFRAN) IV, oxyCODONE-acetaminophen, sodium chloride flush, temazepam  General appearance: alert, cooperative and no distress Neurologic: intact Heart: regular rate and rhythm, S1, S2 normal, no murmur, click, rub or gallop Lungs: clear to auscultation bilaterally Abdomen: soft, non-tender; bowel  sounds normal; no masses,  no organomegaly Extremities: extremities normal, atraumatic, no cyanosis or edema and Homans sign is negative, no sign of DVT Wound: cath site ok  Lab Results: CBC: Recent Labs  09/12/15 0117 09/13/15 0324  WBC 9.3 11.6*  HGB 12.3* 12.9*  HCT 37.6* 40.1  PLT 183 206   BMET:  Recent Labs  09/12/15 0117  NA 136  K 3.9  CL 105  CO2 26  GLUCOSE 101*  BUN 8  CREATININE 0.97  CALCIUM 8.8*    PT/INR: No results for input(s): LABPROT, INR in the last 72 hours.   Radiology No results found.   Assessment/Plan: S/P Procedure(s) (LRB): Left Heart Cath and Coronary Angiography (N/A) Plan CABG in am  The goals risks and alternatives of the planned surgical procedure CABG  have been discussed with the patient in detail. The risks of the procedure including death, infection, stroke, myocardial infarction, bleeding, blood transfusion have all been discussed specifically.  I have quoted Adam Vega a 2% of perioperative mortality and a complication rate as high as 30 %. The patient's questions have been answered.Adam Vega is willing  to proceed with the planned procedure.  In addition to other potential risks and complications from the surgery, I have made the patient  aware of the recent [facility] Advisory concerning the risk of infection by Myocobacterium chimaera related to the use of Stockert 3T heater-cooler equipment during cardiac surgery. I discussed with the patient the low risk of infection, as well as our compliance with the most current FDA recommendations to minimize infection and testing of all devices for contamination. The patient has been made aware of the limited alternatives to immediately replacing the current equipment. The patient has been informed regarding the risks associated with waiting to proceed with needed surgery and that such risks are greater than the risk of infection related to the use of the heater-cooler device. I  did make the patient aware that after careful review of the patients having cardiac surgery at [facility] we have no evidence that heater/cooler related infections [facility]. We discussed that this is a slow-growing bacterium, such that it can take some period of time for symptoms to develop.   Delight OvensEdward B Nikoloz Huy MD 09/13/2015 11:04 AM

## 2015-09-14 ENCOUNTER — Encounter (HOSPITAL_COMMUNITY): Payer: Self-pay | Admitting: Certified Registered Nurse Anesthetist

## 2015-09-14 ENCOUNTER — Encounter (HOSPITAL_COMMUNITY)
Admission: AD | Disposition: A | Discharge status: Home / Self Care | Payer: Self-pay | Source: Other Acute Inpatient Hospital | Attending: Cardiothoracic Surgery

## 2015-09-14 ENCOUNTER — Other Ambulatory Visit (HOSPITAL_COMMUNITY): Payer: Self-pay

## 2015-09-14 ENCOUNTER — Inpatient Hospital Stay (HOSPITAL_COMMUNITY): Payer: PRIVATE HEALTH INSURANCE

## 2015-09-14 ENCOUNTER — Inpatient Hospital Stay (HOSPITAL_COMMUNITY): Payer: PRIVATE HEALTH INSURANCE | Admitting: Certified Registered Nurse Anesthetist

## 2015-09-14 HISTORY — PX: TEE WITHOUT CARDIOVERSION: SHX5443

## 2015-09-14 HISTORY — PX: CORONARY ARTERY BYPASS GRAFT: SHX141

## 2015-09-14 LAB — CREATININE, SERUM
Creatinine, Ser: 0.93 mg/dL (ref 0.61–1.24)
GFR calc Af Amer: >60 mL/min (ref >60)
GFR calc non Af Amer: >60 mL/min (ref >60)

## 2015-09-14 LAB — POCT I-STAT, CHEM 8
BUN: 3 mg/dL — AB (ref 6–20)
BUN: 3 mg/dL — AB (ref 6–20)
BUN: 3 mg/dL — AB (ref 6–20)
BUN: 5 mg/dL — AB (ref 6–20)
CALCIUM ION: 1.22 mmol/L (ref 1.12–1.23)
CALCIUM ION: 1.22 mmol/L (ref 1.12–1.23)
CALCIUM ION: 1.04 mmol/L — AB (ref 1.12–1.23)
CALCIUM ION: 1.08 mmol/L — AB (ref 1.12–1.23)
CHLORIDE: 102 mmol/L (ref 101–111)
CHLORIDE: 104 mmol/L (ref 101–111)
CHLORIDE: 100 mmol/L — AB (ref 101–111)
CREATININE: 0.6 mg/dL — AB (ref 0.61–1.24)
CREATININE: 0.6 mg/dL — AB (ref 0.61–1.24)
CREATININE: 0.6 mg/dL — AB (ref 0.61–1.24)
CREATININE: 0.7 mg/dL (ref 0.61–1.24)
CREATININE: 0.7 mg/dL (ref 0.61–1.24)
CREATININE: 0.8 mg/dL (ref 0.61–1.24)
Calcium, Ion: 1.06 mmol/L — ABNORMAL LOW (ref 1.12–1.23)
Calcium, Ion: 1.12 mmol/L (ref 1.12–1.23)
Chloride: 101 mmol/L (ref 101–111)
Chloride: 102 mmol/L (ref 101–111)
Chloride: 106 mmol/L (ref 101–111)
GLUCOSE: 144 mg/dL — AB (ref 65–99)
GLUCOSE: 84 mg/dL (ref 65–99)
GLUCOSE: 92 mg/dL (ref 65–99)
Glucose, Bld: 108 mg/dL — ABNORMAL HIGH (ref 65–99)
Glucose, Bld: 119 mg/dL — ABNORMAL HIGH (ref 65–99)
Glucose, Bld: 115 mg/dL — ABNORMAL HIGH (ref 65–99)
HCT: 36 % — ABNORMAL LOW (ref 39.0–52.0)
HCT: 33 % — ABNORMAL LOW (ref 39.0–52.0)
HCT: 26 % — ABNORMAL LOW (ref 39.0–52.0)
HCT: 29 % — ABNORMAL LOW (ref 39.0–52.0)
HEMATOCRIT: 29 % — AB (ref 39.0–52.0)
HEMATOCRIT: 31 % — AB (ref 39.0–52.0)
Hemoglobin: 12.2 g/dL — ABNORMAL LOW (ref 13.0–17.0)
Hemoglobin: 11.2 g/dL — ABNORMAL LOW (ref 13.0–17.0)
Hemoglobin: 9.9 g/dL — ABNORMAL LOW (ref 13.0–17.0)
Hemoglobin: 8.8 g/dL — ABNORMAL LOW (ref 13.0–17.0)
Hemoglobin: 9.9 g/dL — ABNORMAL LOW (ref 13.0–17.0)
Hemoglobin: 10.5 g/dL — ABNORMAL LOW (ref 13.0–17.0)
POTASSIUM: 3.5 mmol/L (ref 3.5–5.1)
POTASSIUM: 4.4 mmol/L (ref 3.5–5.1)
Potassium: 4 mmol/L (ref 3.5–5.1)
Potassium: 3.8 mmol/L (ref 3.5–5.1)
Potassium: 4.5 mmol/L (ref 3.5–5.1)
Potassium: 4.6 mmol/L (ref 3.5–5.1)
SODIUM: 139 mmol/L (ref 135–145)
Sodium: 138 mmol/L (ref 135–145)
Sodium: 138 mmol/L (ref 135–145)
Sodium: 136 mmol/L (ref 135–145)
Sodium: 137 mmol/L (ref 135–145)
Sodium: 138 mmol/L (ref 135–145)
TCO2: 28 mmol/L (ref 0–100)
TCO2: 29 mmol/L (ref 0–100)
TCO2: 26 mmol/L (ref 0–100)
TCO2: 25 mmol/L (ref 0–100)
TCO2: 26 mmol/L (ref 0–100)
TCO2: 22 mmol/L (ref 0–100)

## 2015-09-14 LAB — POCT I-STAT 3, ART BLOOD GAS (G3+)
ACID-BASE DEFICIT: 2 mmol/L (ref 0.0–2.0)
ACID-BASE DEFICIT: 4 mmol/L — AB (ref 0.0–2.0)
Acid-base deficit: 4 mmol/L — ABNORMAL HIGH (ref 0.0–2.0)
Acid-base deficit: 4 mmol/L — ABNORMAL HIGH (ref 0.0–2.0)
BICARBONATE: 21.7 meq/L (ref 20.0–24.0)
BICARBONATE: 21.1 meq/L (ref 20.0–24.0)
BICARBONATE: 21.1 meq/L (ref 20.0–24.0)
Bicarbonate: 24.4 mEq/L — ABNORMAL HIGH (ref 20.0–24.0)
Bicarbonate: 25.5 mEq/L — ABNORMAL HIGH (ref 20.0–24.0)
O2 SAT: 100 %
O2 Saturation: 93 %
O2 Saturation: 95 %
O2 Saturation: 98 %
O2 Saturation: 96 %
PCO2 ART: 40.4 mmHg (ref 35.0–45.0)
PO2 ART: 293 mmHg — AB (ref 80.0–100.0)
PO2 ART: 85 mmHg (ref 80.0–100.0)
PO2 ART: 117 mmHg — AB (ref 80.0–100.0)
Patient temperature: 37.4
Patient temperature: 37.7
TCO2: 26 mmol/L (ref 0–100)
TCO2: 27 mmol/L (ref 0–100)
TCO2: 23 mmol/L (ref 0–100)
TCO2: 22 mmol/L (ref 0–100)
TCO2: 22 mmol/L (ref 0–100)
pCO2 arterial: 37.9 mmHg (ref 35.0–45.0)
pCO2 arterial: 59 mmHg (ref 35.0–45.0)
pCO2 arterial: 41.9 mmHg (ref 35.0–45.0)
pCO2 arterial: 40.7 mmHg (ref 35.0–45.0)
pH, Arterial: 7.416 (ref 7.350–7.450)
pH, Arterial: 7.246 — ABNORMAL LOW (ref 7.350–7.450)
pH, Arterial: 7.326 — ABNORMAL LOW (ref 7.350–7.450)
pH, Arterial: 7.325 — ABNORMAL LOW (ref 7.350–7.450)
pH, Arterial: 7.33 — ABNORMAL LOW (ref 7.350–7.450)
pO2, Arterial: 83 mmHg (ref 80.0–100.0)
pO2, Arterial: 89 mmHg (ref 80.0–100.0)

## 2015-09-14 LAB — HEMOGLOBIN A1C
HEMOGLOBIN A1C: 5.7 % — AB (ref 4.8–5.6)
Hgb A1c MFr Bld: 5.7 % — ABNORMAL HIGH (ref 4.8–5.6)
MEAN PLASMA GLUCOSE: 117 mg/dL
Mean Plasma Glucose: 117 mg/dL

## 2015-09-14 LAB — CBC
HCT: 39.4 % (ref 39.0–52.0)
HCT: 32.9 % — ABNORMAL LOW (ref 39.0–52.0)
HEMATOCRIT: 41.5 % (ref 39.0–52.0)
Hemoglobin: 14 g/dL (ref 13.0–17.0)
Hemoglobin: 13 g/dL (ref 13.0–17.0)
Hemoglobin: 11 g/dL — ABNORMAL LOW (ref 13.0–17.0)
MCH: 29.5 pg (ref 26.0–34.0)
MCH: 28.9 pg (ref 26.0–34.0)
MCH: 29.3 pg (ref 26.0–34.0)
MCHC: 33.7 g/dL (ref 30.0–36.0)
MCHC: 33 g/dL (ref 30.0–36.0)
MCHC: 33.4 g/dL (ref 30.0–36.0)
MCV: 87.4 fL (ref 78.0–100.0)
MCV: 87.6 fL (ref 78.0–100.0)
MCV: 87.5 fL (ref 78.0–100.0)
Platelets: 220 10*3/uL (ref 150–400)
Platelets: 148 10*3/uL — ABNORMAL LOW (ref 150–400)
Platelets: 132 10*3/uL — ABNORMAL LOW (ref 150–400)
RBC: 4.75 MIL/uL (ref 4.22–5.81)
RBC: 4.5 MIL/uL (ref 4.22–5.81)
RBC: 3.76 MIL/uL — ABNORMAL LOW (ref 4.22–5.81)
RDW: 13.6 % (ref 11.5–15.5)
RDW: 13.7 % (ref 11.5–15.5)
RDW: 13.8 % (ref 11.5–15.5)
WBC: 9.8 10*3/uL (ref 4.0–10.5)
WBC: 20.8 10*3/uL — ABNORMAL HIGH (ref 4.0–10.5)
WBC: 17.9 10*3/uL — ABNORMAL HIGH (ref 4.0–10.5)

## 2015-09-14 LAB — POCT I-STAT 4, (NA,K, GLUC, HGB,HCT)
Glucose, Bld: 135 mg/dL — ABNORMAL HIGH (ref 65–99)
HCT: 38 % — ABNORMAL LOW (ref 39.0–52.0)
Hemoglobin: 12.9 g/dL — ABNORMAL LOW (ref 13.0–17.0)
POTASSIUM: 4.1 mmol/L (ref 3.5–5.1)
SODIUM: 139 mmol/L (ref 135–145)

## 2015-09-14 LAB — APTT: aPTT: 30 seconds (ref 24–37)

## 2015-09-14 LAB — BASIC METABOLIC PANEL
Anion gap: 7 (ref 5–15)
BUN: <5 mg/dL — ABNORMAL LOW (ref 6–20)
CO2: 27 mmol/L (ref 22–32)
Calcium: 9.1 mg/dL (ref 8.9–10.3)
Chloride: 105 mmol/L (ref 101–111)
Creatinine, Ser: 1 mg/dL (ref 0.61–1.24)
GFR calc Af Amer: >60 mL/min (ref >60)
GFR calc non Af Amer: >60 mL/min (ref >60)
Glucose, Bld: 104 mg/dL — ABNORMAL HIGH (ref 65–99)
Potassium: 3.8 mmol/L (ref 3.5–5.1)
Sodium: 139 mmol/L (ref 135–145)

## 2015-09-14 LAB — GLUCOSE, CAPILLARY
Glucose-Capillary: 111 mg/dL — ABNORMAL HIGH (ref 65–99)
Glucose-Capillary: 104 mg/dL — ABNORMAL HIGH (ref 65–99)
Glucose-Capillary: 118 mg/dL — ABNORMAL HIGH (ref 65–99)
Glucose-Capillary: 105 mg/dL — ABNORMAL HIGH (ref 65–99)

## 2015-09-14 LAB — PROTIME-INR
INR: 1.44 (ref 0.00–1.49)
Prothrombin Time: 17.6 seconds — ABNORMAL HIGH (ref 11.6–15.2)

## 2015-09-14 LAB — HEMOGLOBIN AND HEMATOCRIT, BLOOD
HCT: 28.7 % — ABNORMAL LOW (ref 39.0–52.0)
Hemoglobin: 9.5 g/dL — ABNORMAL LOW (ref 13.0–17.0)

## 2015-09-14 LAB — PLATELET COUNT: Platelets: 151 10*3/uL (ref 150–400)

## 2015-09-14 LAB — MAGNESIUM: Magnesium: 3.2 mg/dL — ABNORMAL HIGH (ref 1.7–2.4)

## 2015-09-14 LAB — HEPARIN LEVEL (UNFRACTIONATED): Heparin Unfractionated: 0.34 IU/mL (ref 0.30–0.70)

## 2015-09-14 SURGERY — CORONARY ARTERY BYPASS GRAFTING (CABG)
Anesthesia: General | Site: Chest

## 2015-09-14 MED ORDER — ACETAMINOPHEN 160 MG/5ML PO SOLN: 650.0000 mg | Freq: Once | ORAL | Status: AC

## 2015-09-14 MED ORDER — MIDAZOLAM HCL 10 MG/2ML IJ SOLN
INTRAMUSCULAR | Status: AC
  Filled 2015-09-14: qty 2

## 2015-09-14 MED ORDER — SODIUM CHLORIDE 0.9 % IV SOLN
250.0000 [IU] | INTRAVENOUS | Status: DC | PRN
  Administered 2015-09-14: 1.7 [IU]/h via INTRAVENOUS

## 2015-09-14 MED ORDER — ALBUMIN HUMAN 5 % IV SOLN
INTRAVENOUS | Status: DC | PRN
  Administered 2015-09-14: 11:00:00 via INTRAVENOUS

## 2015-09-14 MED ORDER — DEXMEDETOMIDINE HCL IN NACL 200 MCG/50ML IV SOLN
0.0000 ug/kg/h | INTRAVENOUS | Status: DC
  Filled 2015-09-14: qty 50

## 2015-09-14 MED ORDER — METOPROLOL TARTRATE 25 MG/10 ML ORAL SUSPENSION: 12.5000 mg | Freq: Two times a day (BID) | ORAL | Status: DC

## 2015-09-14 MED ORDER — POTASSIUM CHLORIDE 10 MEQ/50ML IV SOLN: 10.0000 meq | INTRAVENOUS | Status: AC

## 2015-09-14 MED ORDER — ACETAMINOPHEN 160 MG/5ML PO SOLN: 1000.0000 mg | Freq: Four times a day (QID) | ORAL | Status: DC

## 2015-09-14 MED ORDER — CHLORHEXIDINE GLUCONATE 0.12% ORAL RINSE (MEDLINE KIT)
15.0000 mL | Freq: Two times a day (BID) | OROMUCOSAL | Status: DC
  Administered 2015-09-14 (×2): 15 mL via OROMUCOSAL

## 2015-09-14 MED ORDER — ONDANSETRON HCL 4 MG/2ML IJ SOLN: 4.0000 mg | Freq: Four times a day (QID) | INTRAMUSCULAR | Status: DC | PRN

## 2015-09-14 MED ORDER — VECURONIUM BROMIDE 10 MG IV SOLR
INTRAVENOUS | Status: DC | PRN
  Administered 2015-09-14: 4 mg via INTRAVENOUS
  Administered 2015-09-14: 6 mg via INTRAVENOUS
  Administered 2015-09-14: 5 mg via INTRAVENOUS
  Administered 2015-09-14: 10 mg via INTRAVENOUS
  Administered 2015-09-14: 5 mg via INTRAVENOUS

## 2015-09-14 MED ORDER — OXYCODONE HCL 5 MG PO TABS
5.0000 mg | ORAL_TABLET | ORAL | Status: DC | PRN
  Administered 2015-09-15 – 2015-09-17 (×5): 10 mg via ORAL
  Filled 2015-09-14 (×5): qty 2

## 2015-09-14 MED ORDER — GLYCOPYRROLATE 0.2 MG/ML IV SOSY
PREFILLED_SYRINGE | INTRAVENOUS | Status: DC | PRN
  Administered 2015-09-14: .2 mg via INTRAVENOUS

## 2015-09-14 MED ORDER — LEVOFLOXACIN IN D5W 750 MG/150ML IV SOLN
750.0000 mg | INTRAVENOUS | Status: AC
  Administered 2015-09-14: 750 mg via INTRAVENOUS
  Filled 2015-09-14: qty 150

## 2015-09-14 MED ORDER — FAMOTIDINE IN NACL 20-0.9 MG/50ML-% IV SOLN
20.0000 mg | Freq: Two times a day (BID) | INTRAVENOUS | Status: AC
  Administered 2015-09-14: 20 mg via INTRAVENOUS

## 2015-09-14 MED ORDER — PROPOFOL 10 MG/ML IV BOLUS
INTRAVENOUS | Status: AC
  Filled 2015-09-14: qty 40

## 2015-09-14 MED ORDER — FENTANYL CITRATE (PF) 250 MCG/5ML IJ SOLN
INTRAMUSCULAR | Status: AC
  Filled 2015-09-14: qty 20

## 2015-09-14 MED ORDER — SODIUM CHLORIDE 0.45 % IV SOLN
INTRAVENOUS | Status: DC | PRN
  Administered 2015-09-14: 13:00:00 via INTRAVENOUS

## 2015-09-14 MED ORDER — ALBUMIN HUMAN 5 % IV SOLN
250.0000 mL | INTRAVENOUS | Status: AC | PRN
  Administered 2015-09-14 (×3): 250 mL via INTRAVENOUS
  Filled 2015-09-14: qty 250

## 2015-09-14 MED ORDER — MIDAZOLAM HCL 5 MG/5ML IJ SOLN
INTRAMUSCULAR | Status: DC | PRN
  Administered 2015-09-14 (×2): 1 mg via INTRAVENOUS
  Administered 2015-09-14: 3 mg via INTRAVENOUS
  Administered 2015-09-14: 4 mg via INTRAVENOUS
  Administered 2015-09-14: 1 mg via INTRAVENOUS

## 2015-09-14 MED ORDER — LACTATED RINGERS IV SOLN
INTRAVENOUS | Status: DC | PRN
  Administered 2015-09-14: 06:00:00 via INTRAVENOUS

## 2015-09-14 MED ORDER — ACETAMINOPHEN 650 MG RE SUPP
650.0000 mg | Freq: Once | RECTAL | Status: AC
  Administered 2015-09-14: 650 mg via RECTAL

## 2015-09-14 MED ORDER — MORPHINE SULFATE (PF) 2 MG/ML IV SOLN
2.0000 mg | INTRAVENOUS | Status: DC | PRN
  Administered 2015-09-14 – 2015-09-15 (×3): 2 mg via INTRAVENOUS
  Administered 2015-09-15: 4 mg via INTRAVENOUS
  Filled 2015-09-14: qty 2
  Filled 2015-09-14 (×2): qty 1

## 2015-09-14 MED ORDER — LACTATED RINGERS IV SOLN: 500.0000 mL | Freq: Once | INTRAVENOUS | Status: DC | PRN

## 2015-09-14 MED ORDER — METOPROLOL TARTRATE 5 MG/5ML IV SOLN
2.5000 mg | INTRAVENOUS | Status: DC | PRN
Start: 2015-09-14 — End: 2015-09-18

## 2015-09-14 MED ORDER — BISACODYL 10 MG RE SUPP: 10.0000 mg | Freq: Every day | RECTAL | Status: DC

## 2015-09-14 MED ORDER — MIDAZOLAM HCL 2 MG/2ML IJ SOLN
2.0000 mg | INTRAMUSCULAR | Status: DC | PRN
  Administered 2015-09-14: 2 mg via INTRAVENOUS
  Filled 2015-09-14: qty 2

## 2015-09-14 MED ORDER — DOCUSATE SODIUM 100 MG PO CAPS
200.0000 mg | ORAL_CAPSULE | Freq: Every day | ORAL | Status: DC
  Administered 2015-09-15 – 2015-09-18 (×4): 200 mg via ORAL
  Filled 2015-09-14 (×4): qty 2

## 2015-09-14 MED ORDER — PROPOFOL 10 MG/ML IV BOLUS
INTRAVENOUS | Status: DC | PRN
Start: 2015-09-14 — End: 2015-09-14
  Administered 2015-09-14: 120 mg via INTRAVENOUS

## 2015-09-14 MED ORDER — SODIUM CHLORIDE 0.9 % IJ SOLN
OROMUCOSAL | Status: DC | PRN
  Administered 2015-09-14: 4 mL via TOPICAL

## 2015-09-14 MED ORDER — VANCOMYCIN HCL IN DEXTROSE 1-5 GM/200ML-% IV SOLN
1000.0000 mg | Freq: Once | INTRAVENOUS | Status: AC
  Administered 2015-09-14: 1000 mg via INTRAVENOUS
  Filled 2015-09-14: qty 200

## 2015-09-14 MED ORDER — NITROGLYCERIN IN D5W 200-5 MCG/ML-% IV SOLN: 0.0000 ug/min | INTRAVENOUS | Status: DC

## 2015-09-14 MED ORDER — HEPARIN SODIUM (PORCINE) 1000 UNIT/ML IJ SOLN
INTRAMUSCULAR | Status: DC | PRN
  Administered 2015-09-14: 25000 [IU] via INTRAVENOUS

## 2015-09-14 MED ORDER — LACTATED RINGERS IV SOLN: INTRAVENOUS | Status: DC

## 2015-09-14 MED ORDER — SODIUM CHLORIDE 0.9 % IV SOLN: 250.0000 mL | INTRAVENOUS | Status: DC

## 2015-09-14 MED ORDER — INSULIN REGULAR BOLUS VIA INFUSION
0.0000 [IU] | Freq: Three times a day (TID) | INTRAVENOUS | Status: DC
  Filled 2015-09-14: qty 10

## 2015-09-14 MED ORDER — SODIUM CHLORIDE 0.9 % IV SOLN: INTRAVENOUS | Status: DC

## 2015-09-14 MED ORDER — INSULIN ASPART 100 UNIT/ML ~~LOC~~ SOLN
0.0000 [IU] | SUBCUTANEOUS | Status: AC
  Administered 2015-09-15: 2 [IU] via SUBCUTANEOUS

## 2015-09-14 MED ORDER — HEMOSTATIC AGENTS (NO CHARGE) OPTIME
TOPICAL | Status: DC | PRN
  Administered 2015-09-14 (×2): 1 via TOPICAL

## 2015-09-14 MED ORDER — LEVALBUTEROL HCL 0.63 MG/3ML IN NEBU
0.6300 mg | INHALATION_SOLUTION | RESPIRATORY_TRACT | Status: DC
  Administered 2015-09-14 (×2): 0.63 mg via RESPIRATORY_TRACT
  Filled 2015-09-14: qty 3

## 2015-09-14 MED ORDER — SODIUM CHLORIDE 0.9% FLUSH
3.0000 mL | Freq: Two times a day (BID) | INTRAVENOUS | Status: DC
  Administered 2015-09-15 – 2015-09-18 (×7): 3 mL via INTRAVENOUS

## 2015-09-14 MED ORDER — FENTANYL CITRATE (PF) 100 MCG/2ML IJ SOLN
INTRAMUSCULAR | Status: DC | PRN
  Administered 2015-09-14: 150 ug via INTRAVENOUS
  Administered 2015-09-14: 100 ug via INTRAVENOUS
  Administered 2015-09-14: 50 ug via INTRAVENOUS
  Administered 2015-09-14: 100 ug via INTRAVENOUS
  Administered 2015-09-14: 250 ug via INTRAVENOUS
  Administered 2015-09-14: 150 ug via INTRAVENOUS
  Administered 2015-09-14: 100 ug via INTRAVENOUS
  Administered 2015-09-14: 50 ug via INTRAVENOUS
  Administered 2015-09-14 (×2): 150 ug via INTRAVENOUS
  Administered 2015-09-14: 100 ug via INTRAVENOUS
  Administered 2015-09-14: 150 ug via INTRAVENOUS

## 2015-09-14 MED ORDER — ASPIRIN 81 MG PO CHEW
324.0000 mg | CHEWABLE_TABLET | Freq: Every day | ORAL | Status: DC
  Filled 2015-09-14: qty 4

## 2015-09-14 MED ORDER — FENTANYL CITRATE (PF) 250 MCG/5ML IJ SOLN
INTRAMUSCULAR | Status: AC
  Filled 2015-09-14: qty 5

## 2015-09-14 MED ORDER — MAGNESIUM SULFATE 4 GM/100ML IV SOLN
4.0000 g | Freq: Once | INTRAVENOUS | Status: AC
  Administered 2015-09-14: 4 g via INTRAVENOUS
  Filled 2015-09-14: qty 100

## 2015-09-14 MED ORDER — PROTAMINE SULFATE 10 MG/ML IV SOLN
INTRAVENOUS | Status: DC | PRN
  Administered 2015-09-14: 220 mg via INTRAVENOUS

## 2015-09-14 MED ORDER — BISACODYL 5 MG PO TBEC
10.0000 mg | DELAYED_RELEASE_TABLET | Freq: Every day | ORAL | Status: DC
  Administered 2015-09-15 – 2015-09-18 (×3): 10 mg via ORAL
  Filled 2015-09-14 (×4): qty 2

## 2015-09-14 MED ORDER — PANTOPRAZOLE SODIUM 40 MG PO TBEC
40.0000 mg | DELAYED_RELEASE_TABLET | Freq: Every day | ORAL | Status: DC
  Administered 2015-09-16 – 2015-09-18 (×3): 40 mg via ORAL
  Filled 2015-09-14 (×3): qty 1

## 2015-09-14 MED ORDER — LEVALBUTEROL HCL 0.63 MG/3ML IN NEBU
0.6300 mg | INHALATION_SOLUTION | RESPIRATORY_TRACT | Status: DC
  Administered 2015-09-14 (×2): 0.63 mg via RESPIRATORY_TRACT
  Filled 2015-09-14 (×3): qty 3

## 2015-09-14 MED ORDER — SODIUM CHLORIDE 0.9% FLUSH: 3.0000 mL | INTRAVENOUS | Status: DC | PRN

## 2015-09-14 MED ORDER — PHENYLEPHRINE HCL 10 MG/ML IJ SOLN
0.0000 ug/min | INTRAVENOUS | Status: DC
  Filled 2015-09-14: qty 2

## 2015-09-14 MED ORDER — CETYLPYRIDINIUM CHLORIDE 0.05 % MT LIQD
7.0000 mL | Freq: Two times a day (BID) | OROMUCOSAL | Status: DC
  Administered 2015-09-16 (×2): 7 mL via OROMUCOSAL

## 2015-09-14 MED ORDER — SODIUM CHLORIDE 0.9 % IV SOLN
INTRAVENOUS | Status: DC
  Filled 2015-09-14: qty 2.5

## 2015-09-14 MED ORDER — ASPIRIN EC 325 MG PO TBEC
325.0000 mg | DELAYED_RELEASE_TABLET | Freq: Every day | ORAL | Status: DC
  Administered 2015-09-15 – 2015-09-18 (×4): 325 mg via ORAL
  Filled 2015-09-14 (×4): qty 1

## 2015-09-14 MED ORDER — METOPROLOL TARTRATE 12.5 MG HALF TABLET
12.5000 mg | ORAL_TABLET | Freq: Two times a day (BID) | ORAL | Status: DC
  Administered 2015-09-17 – 2015-09-18 (×3): 12.5 mg via ORAL
  Filled 2015-09-14 (×6): qty 1

## 2015-09-14 MED ORDER — LACTATED RINGERS IV SOLN
INTRAVENOUS | Status: DC | PRN
  Administered 2015-09-14: 07:00:00 via INTRAVENOUS

## 2015-09-14 MED ORDER — MORPHINE SULFATE (PF) 2 MG/ML IV SOLN
1.0000 mg | INTRAVENOUS | Status: AC | PRN
  Administered 2015-09-14: 2 mg via INTRAVENOUS
  Filled 2015-09-14 (×2): qty 1

## 2015-09-14 MED ORDER — LACTATED RINGERS IV SOLN
INTRAVENOUS | Status: DC
  Administered 2015-09-14: 13:00:00 via INTRAVENOUS

## 2015-09-14 MED ORDER — TRAMADOL HCL 50 MG PO TABS
50.0000 mg | ORAL_TABLET | ORAL | Status: DC | PRN
  Administered 2015-09-15 (×3): 100 mg via ORAL
  Filled 2015-09-14 (×3): qty 2

## 2015-09-14 MED ORDER — PHENYLEPHRINE HCL 10 MG/ML IJ SOLN
10.0000 mg | INTRAMUSCULAR | Status: DC | PRN
  Administered 2015-09-14: 25 ug/min via INTRAVENOUS

## 2015-09-14 MED ORDER — ANTISEPTIC ORAL RINSE SOLUTION (CORINZ)
7.0000 mL | Freq: Four times a day (QID) | OROMUCOSAL | Status: DC
  Administered 2015-09-14 – 2015-09-15 (×5): 7 mL via OROMUCOSAL

## 2015-09-14 MED ORDER — ACETAMINOPHEN 500 MG PO TABS
1000.0000 mg | ORAL_TABLET | Freq: Four times a day (QID) | ORAL | Status: DC
  Administered 2015-09-14 – 2015-09-18 (×14): 1000 mg via ORAL
  Filled 2015-09-14 (×15): qty 2

## 2015-09-14 MED ORDER — CHLORHEXIDINE GLUCONATE 0.12 % MT SOLN
15.0000 mL | OROMUCOSAL | Status: AC
  Administered 2015-09-14: 15 mL via OROMUCOSAL

## 2015-09-14 MED ORDER — LEVOFLOXACIN IN D5W 750 MG/150ML IV SOLN
750.0000 mg | INTRAVENOUS | Status: AC
  Administered 2015-09-15: 750 mg via INTRAVENOUS
  Filled 2015-09-14: qty 150

## 2015-09-14 MED FILL — Sodium Chloride IV Soln 0.9%: INTRAVENOUS | Qty: 2000 | Status: AC

## 2015-09-14 MED FILL — Heparin Sodium (Porcine) Inj 1000 Unit/ML: INTRAMUSCULAR | Qty: 30 | Status: AC

## 2015-09-14 MED FILL — Heparin Sodium (Porcine) 100 Unt/ML in Sodium Chloride 0.45%: INTRAMUSCULAR | Qty: 250 | Status: AC

## 2015-09-14 MED FILL — Lidocaine HCl IV Inj 20 MG/ML: INTRAVENOUS | Qty: 5 | Status: AC

## 2015-09-14 MED FILL — Sodium Bicarbonate IV Soln 8.4%: INTRAVENOUS | Qty: 50 | Status: AC

## 2015-09-14 MED FILL — Magnesium Sulfate Inj 50%: INTRAMUSCULAR | Qty: 10 | Status: AC

## 2015-09-14 MED FILL — Mannitol IV Soln 20%: INTRAVENOUS | Qty: 500 | Status: AC

## 2015-09-14 MED FILL — Potassium Chloride Inj 2 mEq/ML: INTRAVENOUS | Qty: 40 | Status: AC

## 2015-09-14 MED FILL — Electrolyte-R (PH 7.4) Solution: INTRAVENOUS | Qty: 3000 | Status: AC

## 2015-09-14 MED FILL — Heparin Sodium (Porcine) Inj 1000 Unit/ML: INTRAMUSCULAR | Qty: 10 | Status: AC

## 2015-09-14 SURGICAL SUPPLY — 69 items
BAG DECANTER FOR FLEXI CONT (MISCELLANEOUS) ×3 IMPLANT
BANDAGE ELASTIC 4 VELCRO ST LF (GAUZE/BANDAGES/DRESSINGS) ×3 IMPLANT
BANDAGE ELASTIC 6 VELCRO ST LF (GAUZE/BANDAGES/DRESSINGS) ×3 IMPLANT
BLADE STERNUM SYSTEM 6 (BLADE) ×3 IMPLANT
BNDG GAUZE ELAST 4 BULKY (GAUZE/BANDAGES/DRESSINGS) ×3 IMPLANT
CANISTER SUCTION 2500CC (MISCELLANEOUS) ×3 IMPLANT
CATH CPB KIT GERHARDT (MISCELLANEOUS) ×3 IMPLANT
CATH THORACIC 28FR (CATHETERS) ×3 IMPLANT
CONT SPEC 4OZ CLIKSEAL STRL BL (MISCELLANEOUS) ×3 IMPLANT
CRADLE DONUT ADULT HEAD (MISCELLANEOUS) ×3 IMPLANT
DRAIN CHANNEL 28F RND 3/8 FF (WOUND CARE) ×3 IMPLANT
DRAPE CARDIOVASCULAR INCISE (DRAPES) ×1
DRAPE SLUSH/WARMER DISC (DRAPES) ×3 IMPLANT
DRAPE SRG 135X102X78XABS (DRAPES) ×2 IMPLANT
DRSG AQUACEL AG ADV 3.5X14 (GAUZE/BANDAGES/DRESSINGS) ×3 IMPLANT
ELECT BLADE 4.0 EZ CLEAN MEGAD (MISCELLANEOUS) ×3
ELECT REM PT RETURN 9FT ADLT (ELECTROSURGICAL) ×6
ELECTRODE BLDE 4.0 EZ CLN MEGD (MISCELLANEOUS) ×2 IMPLANT
ELECTRODE REM PT RTRN 9FT ADLT (ELECTROSURGICAL) ×4 IMPLANT
FELT TEFLON 1X6 (MISCELLANEOUS) ×3 IMPLANT
GAUZE SPONGE 4X4 12PLY STRL (GAUZE/BANDAGES/DRESSINGS) ×6 IMPLANT
GLOVE BIO SURGEON STRL SZ 6.5 (GLOVE) ×9 IMPLANT
GLOVE BIO SURGEON STRL SZ7.5 (GLOVE) ×6 IMPLANT
GLOVE BIOGEL PI IND STRL 6 (GLOVE) ×10 IMPLANT
GLOVE BIOGEL PI INDICATOR 6 (GLOVE) ×5
GOWN STRL REUS W/ TWL LRG LVL3 (GOWN DISPOSABLE) ×20 IMPLANT
GOWN STRL REUS W/TWL LRG LVL3 (GOWN DISPOSABLE) ×10
HEMOSTAT POWDER SURGIFOAM 1G (HEMOSTASIS) ×9 IMPLANT
HEMOSTAT SURGICEL 2X14 (HEMOSTASIS) ×3 IMPLANT
KIT BASIN OR (CUSTOM PROCEDURE TRAY) ×3 IMPLANT
KIT CATH SUCT 8FR (CATHETERS) ×3 IMPLANT
KIT ROOM TURNOVER OR (KITS) ×3 IMPLANT
KIT SUCTION CATH 14FR (SUCTIONS) ×6 IMPLANT
KIT VASOVIEW 6 PRO VH 2400 (KITS) ×3 IMPLANT
LEAD PACING MYOCARDI (MISCELLANEOUS) ×3 IMPLANT
MARKER GRAFT CORONARY BYPASS (MISCELLANEOUS) ×9 IMPLANT
NS IRRIG 1000ML POUR BTL (IV SOLUTION) ×15 IMPLANT
PACK OPEN HEART (CUSTOM PROCEDURE TRAY) ×3 IMPLANT
PAD ARMBOARD 7.5X6 YLW CONV (MISCELLANEOUS) ×6 IMPLANT
PAD ELECT DEFIB RADIOL ZOLL (MISCELLANEOUS) ×3 IMPLANT
PENCIL BUTTON HOLSTER BLD 10FT (ELECTRODE) ×3 IMPLANT
SET CARDIOPLEGIA MPS 5001102 (MISCELLANEOUS) ×3 IMPLANT
SPONGE GAUZE 4X4 12PLY STER LF (GAUZE/BANDAGES/DRESSINGS) ×6 IMPLANT
SPONGE LAP 18X18 X RAY DECT (DISPOSABLE) ×9 IMPLANT
SURGIFLO W/THROMBIN 8M KIT (HEMOSTASIS) ×3 IMPLANT
SUT BONE WAX W31G (SUTURE) ×3 IMPLANT
SUT MNCRL AB 3-0 PS2 18 (SUTURE) ×3 IMPLANT
SUT PROLENE 3 0 SH1 36 (SUTURE) ×3 IMPLANT
SUT PROLENE 4 0 TF (SUTURE) ×9 IMPLANT
SUT PROLENE 6 0 C 1 30 (SUTURE) ×6 IMPLANT
SUT PROLENE 6 0 CC (SUTURE) ×6 IMPLANT
SUT PROLENE 7 0 BV 1 (SUTURE) ×6 IMPLANT
SUT PROLENE 7 0 BV1 MDA (SUTURE) ×3 IMPLANT
SUT SILK 3 0 REEL (SUTURE) ×3 IMPLANT
SUT SILK 4 0 REEL (SUTURE) ×3 IMPLANT
SUT STEEL 6MS V (SUTURE) ×3 IMPLANT
SUT STEEL SZ 6 DBL 3X14 BALL (SUTURE) ×3 IMPLANT
SUT VIC AB 1 CTX 18 (SUTURE) ×6 IMPLANT
SUT VIC AB 3-0 SH 27 (SUTURE) ×1
SUT VIC AB 3-0 SH 27X BRD (SUTURE) ×2 IMPLANT
SUTURE E-PAK OPEN HEART (SUTURE) ×3 IMPLANT
SYSTEM SAHARA CHEST DRAIN ATS (WOUND CARE) ×3 IMPLANT
TAPE CLOTH SURG 4X10 WHT LF (GAUZE/BANDAGES/DRESSINGS) ×3 IMPLANT
TOWEL OR 17X24 6PK STRL BLUE (TOWEL DISPOSABLE) ×6 IMPLANT
TOWEL OR 17X26 10 PK STRL BLUE (TOWEL DISPOSABLE) ×6 IMPLANT
TRAY FOLEY IC TEMP SENS 16FR (CATHETERS) ×3 IMPLANT
TUBING INSUFFLATION (TUBING) ×3 IMPLANT
UNDERPAD 30X30 INCONTINENT (UNDERPADS AND DIAPERS) ×3 IMPLANT
WATER STERILE IRR 1000ML POUR (IV SOLUTION) ×6 IMPLANT

## 2015-09-14 NOTE — Anesthesia Preprocedure Evaluation (Signed)
Anesthesia Evaluation  Patient identified by MRN, date of birth, ID band Patient awake    Reviewed: Allergy & Precautions, NPO status , Patient's Chart, lab work & pertinent test results  Airway Mallampati: II   Neck ROM: full    Dental   Pulmonary Current Smoker,    breath sounds clear to auscultation       Cardiovascular + CAD, + Past MI and +CHF   Rhythm:regular Rate:Normal  Recent cath showed multi-vessel disease.  EF 30-35%.   Neuro/Psych    GI/Hepatic   Endo/Other    Renal/GU      Musculoskeletal   Abdominal   Peds  Hematology   Anesthesia Other Findings   Reproductive/Obstetrics                             Anesthesia Physical Anesthesia Plan  ASA: III  Anesthesia Plan: General   Post-op Pain Management:    Induction: Intravenous  Airway Management Planned: Oral ETT  Additional Equipment: Arterial line, CVP, PA Cath, TEE and Ultrasound Guidance Line Placement  Intra-op Plan:   Post-operative Plan: Post-operative intubation/ventilation  Informed Consent: I have reviewed the patients History and Physical, chart, labs and discussed the procedure including the risks, benefits and alternatives for the proposed anesthesia with the patient or authorized representative who has indicated his/her understanding and acceptance.     Plan Discussed with: CRNA, Anesthesiologist and Surgeon  Anesthesia Plan Comments:         Anesthesia Quick Evaluation

## 2015-09-14 NOTE — Progress Notes (Signed)
Patient ID: Virl SonRichard K Gaona, male   DOB: 26-Mar-1960, 55 y.o.   MRN: [id number]    Subjective:  Denies SSCP, palpitations or Dyspnea For CABG today  Objective:  Filed Vitals:   09/13/15 0500 09/13/15 1340 09/13/15 2109 09/14/15 0530  BP: 106/67 105/65 97/67 114/77  Pulse: 69 70 79 73  Temp: 99.4 F (37.4 C) 98.3 F (36.8 C) 98.4 F (36.9 C) 97.8 F (36.6 C)  TempSrc:  Oral Oral   Resp: 15 16 18 19   Height:      Weight: 143 lb (64.864 kg)   143 lb (64.864 kg)  SpO2: 95% 98% 98% 100%    Intake/Output from previous day:  Intake/Output Summary (Last 24 hours) at 09/14/15 0936 Last data filed at 09/14/15 0930  Gross per 24 hour  Intake  734.8 ml  Output    910 ml  Net -175.2 ml    Physical Exam: Affect appropriate Healthy:  appears stated age HEENT: normal Neck supple with no adenopathy JVP normal no bruits no thyromegaly Lungs clear with no wheezing and good diaphragmatic motion Heart:  S1/S2 no murmur, no rub, gallop or click PMI normal Abdomen: benighn, BS positve, no tenderness, no AAA no bruit.  No HSM or HJR Distal pulses intact with no bruits No edema Neuro non-focal Skin warm and dry No muscular weakness   Lab Results: Basic Metabolic Panel:  Recent Labs  78/29/5606/24/17 0117 09/14/15 0422  NA 136 139  K 3.9 3.8  CL 105 105  CO2 26 27  GLUCOSE 101* 104*  BUN 8 <5*  CREATININE 0.97 1.00  CALCIUM 8.8* 9.1   Liver Function Tests:  Recent Labs  09/12/15 0117  AST 107*  ALT 35  ALKPHOS 41  BILITOT 0.8  PROT 5.3*  ALBUMIN 3.3*   CBC:  Recent Labs  09/13/15 0324 09/14/15 0422  WBC 11.6* 9.8  HGB 12.9* 14.0  HCT 40.1 41.5  MCV 86.2 87.4  PLT 206 220   Cardiac Enzymes:  Recent Labs  09/11/15 1333 09/11/15 1926 09/12/15 0117  TROPONINI 25.13* 22.18* 22.73*   Fasting Lipid Panel:  Recent Labs  09/12/15 0117  CHOL 140  HDL 28*  LDLCALC 86  TRIG 213128  CHOLHDL 5.0    Imaging: Dg Chest 2 View  09/13/2015  CLINICAL DATA:   Shortness of breath, syncope a few days ago. Scheduled for CABG Tamara. Smoker for 20 years. EXAM: CHEST  2 VIEW COMPARISON:  Chest x-ray dated 09/11/2015 FINDINGS: Heart size is normal. Overall cardiomediastinal silhouette is normal in size and configuration. Probable mild scarring/fibrosis at each lung base. Suspect chronic bronchitic changes centrally. Lungs otherwise clear. No evidence of pneumonia. No pleural effusion or pneumothorax seen. Mild degenerative spurring noted within the thoracic spine. No acute appearing osseous abnormality. IMPRESSION: No active cardiopulmonary disease. Probable mild scarring/fibrosis at each lung base. Suspect chronic bronchitic changes centrally. Electronically Signed   By: Bary RichardStan  Maynard M.D.   On: 09/13/2015 14:37    Cardiac Studies:  ECG:  SR inferolateral T wave inversions    Telemetry:  NSR 09/14/2015   Echo:   Medications:   . [MAR Hold] aspirin  81 mg Oral Daily  . [MAR Hold] atorvastatin  80 mg Oral q1800  . bisacodyl  5 mg Oral Once  . dexmedetomidine  0.1-0.7 mcg/kg/hr Intravenous To OR  . DOPamine  0-10 mcg/kg/min Intravenous To OR  . epinephrine  0-10 mcg/min Intravenous To OR  . heparin 30,000 units/NS 1000 mL solution for CELLSAVER  Other To OR  . insulin (NOVOLIN-R) infusion   Intravenous To OR  . levofloxacin (LEVAQUIN) IV  500 mg Intravenous To OR  . magnesium sulfate  40 mEq Other To OR  . [MAR Hold] nicotine  14 mg Transdermal Q24H  . nitroGLYCERIN  2-200 mcg/min Intravenous To OR  . phenylephrine (NEO-SYNEPHRINE) Adult infusion  30-200 mcg/min Intravenous To OR  . potassium chloride  80 mEq Other To OR  . [MAR Hold] sodium chloride flush  3 mL Intravenous Q12H  . vancomycin  1,250 mg Intravenous To OR     . heparin 1,300 Units/hr (09/13/15 1733)    Assessment/Plan:  [age] y.o. severe CAD Cath reviewed:  Total occlusion of the proximal RCA. RCA is dominant giving 3 large inferior wall vessels taken up the territory of the distal  circumflex. Left right collaterals are noted.  Total occlusion of the proximal to mid LAD beyond the origin of the first elbow perforator. Left left collaterals are noted.  Circumflex is relatively small and widely patent..   The large branching ramus intermedius is widely patent and supplies many of the collaterals to both RCA and LAD.  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 LVEDP is mildly elevated.  Enzymes over 22  Continue heparin for CABG with Dr Tyrone SageGerhardt today   BP soft not on ACE hopefully EF will improve post revascularization Will need f/u EF 3 months consider AICD  Charlton Hawseter Ivionna Verley 09/14/2015, 9:36 AM

## 2015-09-14 NOTE — Progress Notes (Signed)
Vent settings as per MD

## 2015-09-14 NOTE — Anesthesia Postprocedure Evaluation (Signed)
Anesthesia Post Note  Patient: Adam Vega  Procedure(s) Performed: Procedure(s) (LRB): CORONARY ARTERY BYPASS GRAFTING (CABG) times two using left internal mammary to left anterior descending coronary artery and right greater saphenous to distal right coronary artery.  Biopsy of peri- aortic lymoh node. (N/A) TRANSESOPHAGEAL ECHOCARDIOGRAM (TEE) (N/A)  Patient location during evaluation: ICU Anesthesia Type: General Level of consciousness: sedated and patient remains intubated per anesthesia plan Pain management: pain level controlled Vital Signs Assessment: post-procedure vital signs reviewed and stable Respiratory status: patient remains intubated per anesthesia plan Cardiovascular status: stable Anesthetic complications: no    Last Vitals:  Filed Vitals:   09/14/15 1600 09/14/15 1615  BP: 110/82   Pulse: 80 80  Temp: 36.9 C 37 C  Resp: 14 14    Last Pain:  Filed Vitals:   09/14/15 1619  PainSc: 0-No pain                 Daniela Hernan S

## 2015-09-14 NOTE — Progress Notes (Signed)
CT surgery p.m. Rounds  Patient doing well after CABG 2 Hemodynamics stable Atrially paced Extubated and and angled on side of bed Postop labs reviewed and are satisfactory

## 2015-09-14 NOTE — OR Nursing (Signed)
SICU charge nurse 1st call @ 1123.

## 2015-09-14 NOTE — Brief Op Note (Signed)
      [address]       [place] [id number]             [phone]        09/14/2015  12:36 PM  PATIENT:  Adam Vega  55 y.o. male  PRE-OPERATIVE DIAGNOSIS:  Coronary artery disease with non stimi mi  POST-OPERATIVE DIAGNOSIS:  Same   PROCEDURE:  Procedure(s): CORONARY ARTERY BYPASS GRAFTING (CABG) times two using left internal mammary to left anterior descending coronary artery and right greater saphenous to distal right coronary artery.  Biopsy of peri- aortic lymoh node. (N/A) TRANSESOPHAGEAL ECHOCARDIOGRAM (TEE) (N/A)  SURGEON:  Surgeon(s) and Role:    * Delight OvensEdward B Sulayman Manning, MD - Primary   ASSISTANTS: Armanda Magicaul Villarreal SA   ANESTHESIA:   general  EBL:  Total I/O In: 2365 [I.V.:1500; Blood:615; IV Piggyback:250] Out: 2360 [Urine:835; Blood:1525]  BLOOD ADMINISTERED:none  DRAINS: none   LOCAL MEDICATIONS USED:  NONE  SPECIMEN:  Source of Specimen:  peri aortic   DISPOSITION OF SPECIMEN:  PATHOLOGY  COUNTS:  YES   DICTATION: .Dragon Dictation  PLAN OF CARE: Admit to inpatient   PATIENT DISPOSITION:  ICU - intubated and hemodynamically stable.   Delay start of Pharmacological VTE agent (>24hrs) due to surgical blood loss or risk of bleeding: yes

## 2015-09-14 NOTE — Progress Notes (Signed)
SICU wean protocol initiated 16:37

## 2015-09-14 NOTE — Transfer of Care (Signed)
Immediate Anesthesia Transfer of Care Note  Patient: Adam Vega  Procedure(s) Performed: Procedure(s): CORONARY ARTERY BYPASS GRAFTING (CABG) times two using left internal mammary to left anterior descending coronary artery and right greater saphenous to distal right coronary artery.  Biopsy of peri- aortic lymoh node. (N/A) TRANSESOPHAGEAL ECHOCARDIOGRAM (TEE) (N/A)  Patient Location: SICU  Anesthesia Type:General  Level of Consciousness: unresponsive and Patient remains intubated per anesthesia plan  Airway & Oxygen Therapy: Patient remains intubated per anesthesia plan and Patient placed on Ventilator (see vital sign flow sheet for setting)  Post-op Assessment: Report given to RN and Post -op Vital signs reviewed and stable  Post vital signs: Reviewed and stable  Last Vitals:  Filed Vitals:   09/14/15 0530 09/14/15 1258  BP: 114/77   Pulse: 73 86  Temp: 36.6 C   Resp: 19 12    Last Pain:  Filed Vitals:   09/14/15 1303  PainSc: 0-No pain      Patients Stated Pain Goal: 0 (09/11/15 1300)  Complications: No apparent anesthesia complications

## 2015-09-14 NOTE — Anesthesia Procedure Notes (Addendum)
Central Venous Catheter Insertion Performed by: anesthesiologist 09/14/2015 6:42 AM Patient location: Pre-op. Preanesthetic checklist: patient identified, IV checked, risks and benefits discussed, surgical consent, monitors and equipment checked, pre-op evaluation and timeout performed Position: supine Lidocaine 1% used for infiltration Landmarks identified Catheter size: 8.5 Fr PA cath was placed.Sheath introducer Swan type and PA catheter depth:thermodilutionProcedure performed using ultrasound guided technique. Attempts: 1 Following insertion, line sutured and dressing applied. Post procedure assessment: blood return through all ports, free fluid flow and no air. Patient tolerated the procedure well with no immediate complications. Additional procedure comments: PA catheter:  Routine monitors. Timeout, sterile prep, drape, FBP R neck.  Trendelenburg position.  1% Lido local, finder and trocar RIJ 1st pass with US guidance.  Cordis placed over J wire. PA catheter in easily.  Sterile dressing applied.  Patient tolerated well, VSS.  Jenita Seashore, MD.   Procedure Name: Intubation Date/Time: 09/14/2015 7:48 AM Performed by: Bethel Born Pre-anesthesia Checklist: Patient identified, Emergency Drugs available, Suction available, Patient being monitored and Timeout performed Patient Re-evaluated:Patient Re-evaluated prior to inductionOxygen Delivery Method: Circle system utilized Preoxygenation: Pre-oxygenation with 100% oxygen Intubation Type: IV induction Ventilation: Two handed mask ventilation required, Oral airway inserted - appropriate to patient size and Mask ventilation without difficulty Laryngoscope Size: Miller and 2 Grade View: Grade I Tube type: Oral Tube size: 8.0 mm Number of attempts: 1 Airway Equipment and Method: Stylet Placement Confirmation: ETT inserted through vocal cords under direct vision,  positive ETCO2 and breath sounds checked- equal and  bilateral Secured at: 23 cm Tube secured with: Tape Dental Injury: Teeth and Oropharynx as per pre-operative assessment

## 2015-09-14 NOTE — OR Nursing (Signed)
[facility]

## 2015-09-15 ENCOUNTER — Encounter (HOSPITAL_COMMUNITY): Payer: Self-pay | Admitting: Cardiothoracic Surgery

## 2015-09-15 ENCOUNTER — Inpatient Hospital Stay (HOSPITAL_COMMUNITY): Payer: PRIVATE HEALTH INSURANCE

## 2015-09-15 LAB — GLUCOSE, CAPILLARY
GLUCOSE-CAPILLARY: 107 mg/dL — AB (ref 65–99)
GLUCOSE-CAPILLARY: 129 mg/dL — AB (ref 65–99)
GLUCOSE-CAPILLARY: 138 mg/dL — AB (ref 65–99)
Glucose-Capillary: 101 mg/dL — ABNORMAL HIGH (ref 65–99)
Glucose-Capillary: 110 mg/dL — ABNORMAL HIGH (ref 65–99)
Glucose-Capillary: 127 mg/dL — ABNORMAL HIGH (ref 65–99)

## 2015-09-15 LAB — POCT I-STAT, CHEM 8
BUN: 10 mg/dL (ref 6–20)
CHLORIDE: 99 mmol/L — AB (ref 101–111)
Calcium, Ion: 1.17 mmol/L (ref 1.13–1.30)
Creatinine, Ser: 0.8 mg/dL (ref 0.61–1.24)
Glucose, Bld: 129 mg/dL — ABNORMAL HIGH (ref 65–99)
HCT: 30 % — ABNORMAL LOW (ref 39.0–52.0)
Hemoglobin: 10.2 g/dL — ABNORMAL LOW (ref 13.0–17.0)
POTASSIUM: 4.4 mmol/L (ref 3.5–5.1)
SODIUM: 135 mmol/L (ref 135–145)
TCO2: 24 mmol/L (ref 0–100)

## 2015-09-15 LAB — CBC
HCT: 31.8 % — ABNORMAL LOW (ref 39.0–52.0)
HCT: 32.3 % — ABNORMAL LOW (ref 39.0–52.0)
Hemoglobin: 10.3 g/dL — ABNORMAL LOW (ref 13.0–17.0)
Hemoglobin: 10.3 g/dL — ABNORMAL LOW (ref 13.0–17.0)
MCH: 27.9 pg (ref 26.0–34.0)
MCH: 27.8 pg (ref 26.0–34.0)
MCHC: 32.4 g/dL (ref 30.0–36.0)
MCHC: 31.9 g/dL (ref 30.0–36.0)
MCV: 86.2 fL (ref 78.0–100.0)
MCV: 87.3 fL (ref 78.0–100.0)
Platelets: 148 10*3/uL — ABNORMAL LOW (ref 150–400)
Platelets: 161 10*3/uL (ref 150–400)
RBC: 3.69 MIL/uL — ABNORMAL LOW (ref 4.22–5.81)
RBC: 3.7 MIL/uL — ABNORMAL LOW (ref 4.22–5.81)
RDW: 13.9 % (ref 11.5–15.5)
RDW: 13.9 % (ref 11.5–15.5)
WBC: 13.8 10*3/uL — ABNORMAL HIGH (ref 4.0–10.5)
WBC: 14.6 10*3/uL — ABNORMAL HIGH (ref 4.0–10.5)

## 2015-09-15 LAB — BASIC METABOLIC PANEL
Anion gap: 9 (ref 5–15)
BUN: 6 mg/dL (ref 6–20)
CO2: 21 mmol/L — ABNORMAL LOW (ref 22–32)
Calcium: 7.9 mg/dL — ABNORMAL LOW (ref 8.9–10.3)
Chloride: 107 mmol/L (ref 101–111)
Creatinine, Ser: 0.95 mg/dL (ref 0.61–1.24)
GFR calc Af Amer: >60 mL/min (ref >60)
GFR calc non Af Amer: >60 mL/min (ref >60)
Glucose, Bld: 122 mg/dL — ABNORMAL HIGH (ref 65–99)
Potassium: 4.4 mmol/L (ref 3.5–5.1)
Sodium: 137 mmol/L (ref 135–145)

## 2015-09-15 LAB — VAS US DOPPLER PRE CABG
LEFT ECA DIAS: -14 cm/s
LEFT VERTEBRAL DIAS: -19 cm/s
Left CCA dist dias: 24 cm/s
Left CCA dist sys: 98 cm/s
Left CCA prox dias: 31 cm/s
Left CCA prox sys: 102 cm/s
Left ICA dist dias: -31 cm/s
Left ICA dist sys: -68 cm/s
Left ICA prox dias: -26 cm/s
Left ICA prox sys: -100 cm/s
RIGHT VERTEBRAL DIAS: -16 cm/s
Right CCA prox dias: 29 cm/s
Right CCA prox sys: 98 cm/s
Right cca dist sys: -77 cm/s

## 2015-09-15 LAB — MAGNESIUM
Magnesium: 2.5 mg/dL — ABNORMAL HIGH (ref 1.7–2.4)
Magnesium: 2.6 mg/dL — ABNORMAL HIGH (ref 1.7–2.4)

## 2015-09-15 LAB — CREATININE, SERUM
Creatinine, Ser: 0.94 mg/dL (ref 0.61–1.24)
GFR calc Af Amer: >60 mL/min (ref >60)
GFR calc non Af Amer: >60 mL/min (ref >60)

## 2015-09-15 MED ORDER — INSULIN ASPART 100 UNIT/ML ~~LOC~~ SOLN
0.0000 [IU] | SUBCUTANEOUS | Status: DC
  Administered 2015-09-15 (×2): 2 [IU] via SUBCUTANEOUS

## 2015-09-15 MED ORDER — LEVALBUTEROL HCL 0.63 MG/3ML IN NEBU
0.6300 mg | INHALATION_SOLUTION | Freq: Four times a day (QID) | RESPIRATORY_TRACT | Status: DC
  Administered 2015-09-15 – 2015-09-17 (×12): 0.63 mg via RESPIRATORY_TRACT
  Filled 2015-09-15 (×13): qty 3

## 2015-09-15 NOTE — Progress Notes (Signed)
Patient ID: Adam Vega, male   DOB: 1961-01-12, 55 y.o.   MRN: [id number] [facility] DAILY ICU PROGRESS NOTE                   [address]            [place] [id number]          [phone]   1 Day Post-Op Procedure(s) (LRB): CORONARY ARTERY BYPASS GRAFTING (CABG) times two using left internal mammary to left anterior descending coronary artery and right greater saphenous to distal right coronary artery.  Biopsy of peri- aortic lymoh node. (N/A) TRANSESOPHAGEAL ECHOCARDIOGRAM (TEE) (N/A)  Total Length of Stay:  LOS: 4 days   Subjective: Extubated, awake and alert, neuro intact, adequate pain control Objective: Vital signs in last 24 hours: Temp:  [98.4 F (36.9 C)-100.6 F (38.1 C)] 99 F (37.2 C) (06/27 0700) Pulse Rate:  [64-104] 89 (06/27 0700) Cardiac Rhythm:  [-] Atrial paced (06/27 0400) Resp:  [8-39] 26 (06/27 0700) BP: (81-154)/(55-102) 95/72 mmHg (06/27 0700) SpO2:  [93 %-100 %] 96 % (06/27 0700) Arterial Line BP: (85-182)/(46-95) 105/61 mmHg (06/27 0700) FiO2 (%):  [40 %-50 %] 40 % (06/26 1705) Weight:  [152 lb 9.6 oz (69.219 kg)] 152 lb 9.6 oz (69.219 kg) (06/27 0500)  Filed Weights   09/13/15 0500 09/14/15 0530 09/15/15 0500  Weight: 143 lb (64.864 kg) 143 lb (64.864 kg) 152 lb 9.6 oz (69.219 kg)    Weight change: 9 lb 9.6 oz (4.355 kg)   Hemodynamic parameters for last 24 hours: PAP: (19-38)/(9-19) 37/19 mmHg CO:  [2.9 L/min-5.4 L/min] 5.4 L/min CI:  [1.7 L/min/m2-3.1 L/min/m2] 3.1 L/min/m2  Intake/Output from previous day: 06/26 0701 - 06/27 0700 In: 5102.2 [I.V.:3167.2; Blood:615; NG/GT:30; IV Piggyback:1290] Out: 3848 [Urine:2085; Blood:1525; Chest Tube:238]  Intake/Output this shift:    Current Meds: Scheduled Meds: . acetaminophen  1,000 mg Oral Q6H   Or  . acetaminophen (TYLENOL) oral liquid 160 mg/5 mL  1,000 mg Per Tube Q6H  . antiseptic oral rinse  7 mL Mouth Rinse BID  . antiseptic oral rinse  7 mL Mouth Rinse QID  .  aspirin EC  325 mg Oral Daily   Or  . aspirin  324 mg Per Tube Daily  . atorvastatin  80 mg Oral q1800  . bisacodyl  10 mg Oral Daily   Or  . bisacodyl  10 mg Rectal Daily  . docusate sodium  200 mg Oral Daily  . famotidine (PEPCID) IV  20 mg Intravenous Q12H  . insulin aspart  0-24 Units Subcutaneous Q4H  . levalbuterol  0.63 mg Nebulization QID  . levofloxacin (LEVAQUIN) IV  750 mg Intravenous Q24H  . metoprolol tartrate  12.5 mg Oral BID   Or  . metoprolol tartrate  12.5 mg Per Tube BID  . [START ON 09/16/2015] pantoprazole  40 mg Oral Daily  . sodium chloride flush  3 mL Intravenous Q12H   Continuous Infusions: . sodium chloride 20 mL/hr at 09/15/15 0600  . sodium chloride    . sodium chloride 20 mL/hr at 09/14/15 1900  . dexmedetomidine Stopped (09/14/15 1800)  . lactated ringers 20 mL/hr at 09/15/15 0600  . lactated ringers 20 mL/hr at 09/14/15 1900  . nitroGLYCERIN Stopped (09/14/15 1300)  . phenylephrine (NEO-SYNEPHRINE) Adult infusion Stopped (09/14/15 2300)   PRN Meds:.sodium chloride, albumin human, lactated ringers, metoprolol, midazolam, morphine injection, ondansetron (ZOFRAN) IV, oxyCODONE, sodium chloride flush, traMADol  General appearance: alert and  cooperative Neurologic: intact Heart: regular rate and rhythm, S1, S2 normal, no murmur, click, rub or gallop Lungs: clear to auscultation bilaterally Abdomen: soft, non-tender; bowel sounds normal; no masses,  no organomegaly Extremities: extremities normal, atraumatic, no cyanosis or edema Wound: sternum stable  Lab Results: CBC: Recent Labs  09/14/15 1852 09/15/15 0434  WBC 17.9* 13.8*  HGB 11.0* 10.3*  HCT 32.9* 31.8*  PLT 132* 148*   BMET:  Recent Labs  09/14/15 0422  09/14/15 1850 09/14/15 1852 09/15/15 0434  NA 139  < > 138  --  137  K 3.8  < > 4.4  --  4.4  CL 105  < > 106  --  107  CO2 27  --   --   --  21*  GLUCOSE 104*  < > 115*  --  122*  BUN <5*  < > 5*  --  6  CREATININE 1.00  <  > 0.80 0.93 0.95  CALCIUM 9.1  --   --   --  7.9*  < > = values in this interval not displayed.  PT/INR:  Recent Labs  09/14/15 1307  LABPROT 17.6*  INR 1.44   Radiology: Dg Chest Port 1 View  09/14/2015  CLINICAL DATA:  Incorrect needle count after CABG. EXAM: PORTABLE CHEST 1 VIEW COMPARISON:  09/13/2015 FINDINGS: Heart size and pulmonary vascularity are normal. Endotracheal tube, Swan-Ganz catheter, NG tube and chest tubes are in place. No retained needle. Tiny left apical pneumothorax. Minimal atelectasis at the lung bases. IMPRESSION: No retained needle. Minimal bibasilar atelectasis. Tiny left apical pneumothorax. Electronically Signed   By: Francene BoyersJames  Maxwell M.D.   On: 09/14/2015 12:34     Assessment/Plan: S/P Procedure(s) (LRB): CORONARY ARTERY BYPASS GRAFTING (CABG) times two using left internal mammary to left anterior descending coronary artery and right greater saphenous to distal right coronary artery.  Biopsy of peri- aortic lymoh node. (N/A) TRANSESOPHAGEAL ECHOCARDIOGRAM (TEE) (N/A) Mobilize Diuresis d/c tubes/lines See progression orders Expected Acute  Blood - loss Anemia    Delight Ovensdward B Zac Torti 09/15/2015 7:15 AM

## 2015-09-15 NOTE — Op Note (Signed)
NAME:  Janece CanterburyMEREDITH, Tytan            ACCOUNT NO.:  [id number]  MEDICAL RECORD NO.:  [id number]  LOCATION:  O                            FACILITY:  [facility]  PHYSICIAN:  Sheliah PlaneEdward Leanda Padmore, MD    DATE OF BIRTH:  1960-12-18  DATE OF PROCEDURE:  09/14/2015 DATE OF DISCHARGE:                              OPERATIVE REPORT   PREOPERATIVE DIAGNOSIS:  Recent non-ST segment elevation myocardial infarction with coronary occlusive disease.  POSTOPERATIVE DIAGNOSIS:  Recent non-ST segment elevation myocardial infarction with coronary occlusive disease.  SURGICAL PROCEDURE:  Coronary artery bypass grafting x2, with left internal mammary to the left anterior descending coronary artery and reverse saphenous vein graft to the distal right coronary artery. Biopsy of Ascending Periaortic Lymph Node   SURGEON:  Sheliah PlaneEdward Saia Derossett, MD.  FIRST ASSISTANT:  Armanda Magicaul VillaRreal, SA.  BRIEF HISTORY:  The patient is a 55 year old male with history of smoking who presents with 3-week episodes of intermittent pain and then prolonged pain with positive troponins.  He was admitted and underwent cardiac catheterization by Dr. Verdis PrimeHenry Smith.  At the time of catheterization, he was noted to have total occlusion of his right coronary artery proximally with collateral filling and total occlusion of the left anterior descending coronary artery with faint distal collaterals.  Overall, ventricular function was depressed with a 40% ejection fraction.  The patient was stabilized medically.  Cardiac surgery consultation was requested by Dr. Katrinka BlazingSmith.  Risks and options of surgery were discussed with the patient and coronary artery bypass grafting was recommended.  The patient agreed and signed informed consent.  DESCRIPTION OF PROCEDURE:  With Swan-Ganz and arterial line monitors in place, the patient underwent general endotracheal anesthesia without incident.  Skin of chest and legs was prepped with Betadine, draped in usual  sterile manner.  Appropriate time-out was performed.  We proceeded with endo vein harvesting of the right thigh greater saphenous vein which was of good quality and caliber.  Median sternotomy was performed. Left internal mammary artery was dissected down as a pedicle graft.  The distal artery was divided, it had good free flow.  Pericardium was opened.  Overall, ventricular function appeared preserved with no obvious segmental wall abnormality. A slightly enlarged 8mm periaortic lymph node was noted on the ascending aorta in area of proximal graft would be, the node was removed and submitted to pathology.  The patient was systemically heparinized.  The ascending aorta was cannulated.  The right atrium was cannulated.  An aortic root vent cardioplegia needle was introduced into the ascending aorta.  The patient was placed on cardiopulmonary bypass 2.4 L/min/m2.  Sites of anastomosis were selected.  The posterior descending and posterior continuation branch of the right coronary are all appeared to be reasonable vessels.  The patient's body temperature was cooled to 32 degrees.  Aortic crossclamp was applied and 500 mL of cold blood potassium cardioplegia was administered antegrade with diastolic arrest of the heart.  Myocardial septal temperature was monitored throughout the crossclamp.  Attention was turned first to the distal right coronary artery.  The vessel was of good quality and soft without significant disease.  Just at the takeoff of the posterior descending and slightly beyond, the right  coronary artery was opened and admitted a 1.5 mm probe proximally and distally.  Using a segment of reverse saphenous vein graft, the distal anastomosis was performed.  An additional cold blood cardioplegia was administered.  The heart was elevated, and there really was no circumflex system evident via the right catheterization or by examination.  The left anterior descending coronary artery was  opened in the midportion.  A 1.5 mm probe passed distally using a running 8-0 Prolene, left internal mammary artery was anastomosed to the left anterior descending coronary artery.  With the crossclamp still in place, the aortic root vent and the single punch aortotomy was performed and then through the right coronary artery was anastomosed to the ascending aorta.  The heart was allowed to passively fill and de-air.  The bulldog on the mammary artery was removed with prompt rise in myocardial septal temperature.  Aortic crossclamp was removed with total crossclamp time of 50 minutes.  The patient upon simultaneously converted to a sinus rhythm, sites of anastomosis were inspected for any bleeding.  With the body temperature rewarmed to 37 degrees, the patient was then ventilated and weaned from cardiopulmonary bypass on low-dose dopamine which ultimately was turned off.  He has remained hemodynamically stable.  He was decannulated in usual fashion. Protamine sulfate was administered with operative field hemostatic. Atrial and ventricular pacing wires were applied.  Graft marker was applied.  Left pleural tube and a Blake mediastinal drain were left in place.  Pericardium was loosely reapproximated.  The sternum was closed with #6 stainless steel wire.  Fascia was closed with interrupted 0 Vicryl, running 3-0 Vicryl in subcutaneous tissue, and 4-0 subcuticular stitch in skin edges.  Dry dressings were applied.  Sponge and needle count was reported as correct at the completion of the procedure.  The patient tolerated the procedure without obvious complication and was transferred to the Surgical Intensive Care Unit for further postoperative care.  One small  needle was missing, x-ray was obtained in the operating room.  There was no evidence of retained foreign body. Total pump time was 82 minutes.No blood bank blood products were used.     Sheliah PlaneEdward Dorna Mallet, MD     EG/MEDQ  D:   09/14/2015  T:  09/15/2015  Job:  [id number]

## 2015-09-15 NOTE — Care Management Note (Signed)
Case Management Note  Patient Details  Name: Adam Vega MRN: [id number] Date of Birth: September 09, 1960  Subjective/Objective:      Pt is s/p CABG              Action/Plan:  PTA independent from home with wife.  Wife will provide 24 hour supervision as recommended.  CM will continue to monitor for discharge needs   Expected Discharge Date:  09/21/15               Expected Discharge Plan:  Home/Self Care  In-House Referral:     Discharge planning Services  CM Consult  Post Acute Care Choice:    Choice offered to:     DME Arranged:    DME Agency:     HH Arranged:    HH Agency:     Status of Service:  In process, will continue to follow  If discussed at Long Length of Stay Meetings, dates discussed:    Additional Comments:  Cherylann ParrClaxton, Naheem Mosco S, RN 09/15/2015, 2:06 PM

## 2015-09-15 NOTE — Progress Notes (Signed)
      [address]       [place] [id number]             [phone]      POD # 1  Up in chair  BP 119/70 mmHg  Pulse 56  Temp(Src) 98.3 F (36.8 C) (Oral)  Resp 25  Ht 5\' 6"  (1.676 m)  Wt 152 lb 9.6 oz (69.219 kg)  BMI 24.64 kg/m2  SpO2 99%   Intake/Output Summary (Last 24 hours) at 09/15/15 1807 Last data filed at 09/15/15 1700  Gross per 24 hour  Intake 1536.4 ml  Output   1078 ml  Net  458.4 ml    K= 4.4  Doing well POD # 1  Steven C. Dorris FetchHendrickson, MD [facility] [phone]

## 2015-09-15 NOTE — Op Note (Deleted)
NAME:  Janece CanterburyMEREDITH, Vijay            ACCOUNT NO.:  [id number]  MEDICAL RECORD NO.:  [id number]  LOCATION:  O                            FACILITY:  [facility]  PHYSICIAN:  Sheliah PlaneEdward Salimata Christenson, MD    DATE OF BIRTH:  11/04/1960  DATE OF PROCEDURE:  09/14/2015 DATE OF DISCHARGE:                              OPERATIVE REPORT   PREOPERATIVE DIAGNOSIS:  Recent non-ST segment elevation myocardial infarction with coronary occlusive disease.  POSTOPERATIVE DIAGNOSIS:  Recent non-ST segment elevation myocardial infarction with coronary occlusive disease.  SURGICAL PROCEDURE:  Coronary artery bypass grafting x2, with left internal mammary to the left anterior descending coronary artery and reverse saphenous vein graft to the distal right coronary artery.  SURGEON:  Sheliah PlaneEdward Algie Westry, MD.  FIRST ASSISTANBaron Sane:  Avielle, WashingtonA.  BRIEF HISTORY:  The patient is a 55 year old male with history of smoking who presents with 3-week episodes of intermittent pain and then prolonged pain with positive troponins.  He was admitted and underwent cardiac catheterization by Dr. Verdis PrimeHenry Smith.  At the time of catheterization, he was noted to have total occlusion of his right coronary artery proximally with collateral filling and total occlusion of the left anterior descending coronary artery with faint distal collaterals.  Overall, ventricular function was depressed with a 40% ejection fraction.  The patient was stabilized medically.  Cardiac surgery consultation was requested by Dr. Katrinka BlazingSmith.  Risks and options of surgery were discussed with the patient and coronary artery bypass grafting was recommended.  The patient agreed and signed informed consent.  DESCRIPTION OF PROCEDURE:  With Swan-Ganz and arterial line monitors in place, the patient underwent general endotracheal anesthesia without incident.  Skin of chest and legs was prepped with Betadine, draped in usual sterile manner.  Appropriate time-out was performed.  We  proceeded with endo vein harvesting of the right thigh greater saphenous vein which was of good quality and caliber.  Median sternotomy was performed. Left internal mammary artery was dissected down as a pedicle graft.  The distal artery was divided, it had good free flow.  Pericardium was opened.  Overall, ventricular function appeared preserved with no obvious segmental wall abnormality.  The patient was systemically heparinized.  The ascending aorta was cannulated.  The right atrium was cannulated.  An aortic root vent cardioplegia needle was introduced into the ascending aorta.  The patient was placed on cardiopulmonary bypass 2.4 L/min/m2.  Sites of anastomosis were selected.  The posterior descending and posterior continuation branch of the right coronary are all appeared to be reasonable vessels.  The patient's body temperature was cooled to 32 degrees.  Aortic crossclamp was applied and 500 mL of cold blood potassium cardioplegia was administered antegrade with diastolic arrest of the heart.  Myocardial septal temperature was monitored throughout the crossclamp.  Attention was turned first to the distal right coronary artery.  The vessel was of good quality and soft without significant disease.  Just at the takeoff of the posterior descending and slightly beyond, the right coronary artery was opened and admitted a 1.5 mm probe proximally and distally.  Using a segment of reverse saphenous vein graft, the distal anastomosis was performed.  An additional cold blood cardioplegia was administered.  The heart was elevated, and there really was no circumflex system evident via the right catheterization or by examination.  The left anterior descending coronary artery was opened in the midportion.  A 1.5 mm probe passed distally using a running 8-0 Prolene, left internal mammary artery was anastomosed to the left anterior descending coronary artery.  With the crossclamp still in place,  the aortic root vent and the single punch aortotomy was performed and then through the right coronary artery was anastomosed to the ascending aorta.  The heart was allowed to passively fill and de-air.  The bulldog on the mammary artery was removed with prompt rise in myocardial septal temperature.  Aortic crossclamp was removed with total crossclamp time of 50 minutes.  The patient upon simultaneously converted to a sinus rhythm, sites of anastomosis were inspected for any bleeding.  With the body temperature rewarmed to 37 degrees, the patient was then ventilated and weaned from cardiopulmonary bypass on low-dose dopamine which ultimately was turned off.  He has remained hemodynamically stable.  He was decannulated in usual fashion. Protamine sulfate was administered with operative field hemostatic. Atrial and ventricular pacing wires were applied.  Graft marker was applied.  Left pleural tube and a Blake mediastinal drain were left in place.  Pericardium was loosely reapproximated.  The sternum was closed with #6 stainless steel wire.  Fascia was closed with interrupted 0 Vicryl, running 3-0 Vicryl in subcutaneous tissue, and 4-0 subcuticular stitch in skin edges.  Dry dressings were applied.  Sponge and needle count was reported as correct at the completion of the procedure.  The patient tolerated the procedure without obvious complication and was transferred to the Surgical Intensive Care Unit for further postoperative care.  One mL needle was missing, x-ray was obtained in the operating room.  There was no evidence of retained foreign body. Total pump time was 82 minutes.     Sheliah PlaneEdward Sylvan Lahm, MD     EG/MEDQ  D:  09/14/2015  T:  09/15/2015  Job:  [id number]

## 2015-09-15 NOTE — Progress Notes (Signed)
  Echocardiogram Echocardiogram Transesophageal has been performed.  Qiara Minetti 09/15/2015, 8:16 AM

## 2015-09-16 ENCOUNTER — Inpatient Hospital Stay (HOSPITAL_COMMUNITY): Payer: PRIVATE HEALTH INSURANCE

## 2015-09-16 ENCOUNTER — Other Ambulatory Visit: Payer: Self-pay

## 2015-09-16 LAB — CBC
HCT: 30.6 % — ABNORMAL LOW (ref 39.0–52.0)
Hemoglobin: 9.9 g/dL — ABNORMAL LOW (ref 13.0–17.0)
MCH: 28.1 pg (ref 26.0–34.0)
MCHC: 32.4 g/dL (ref 30.0–36.0)
MCV: 86.9 fL (ref 78.0–100.0)
Platelets: 153 10*3/uL (ref 150–400)
RBC: 3.52 MIL/uL — ABNORMAL LOW (ref 4.22–5.81)
RDW: 13.9 % (ref 11.5–15.5)
WBC: 14.6 10*3/uL — ABNORMAL HIGH (ref 4.0–10.5)

## 2015-09-16 LAB — GLUCOSE, CAPILLARY
GLUCOSE-CAPILLARY: 113 mg/dL — AB (ref 65–99)
GLUCOSE-CAPILLARY: 129 mg/dL — AB (ref 65–99)
Glucose-Capillary: 117 mg/dL — ABNORMAL HIGH (ref 65–99)

## 2015-09-16 LAB — BASIC METABOLIC PANEL
Anion gap: 7 (ref 5–15)
BUN: 10 mg/dL (ref 6–20)
CO2: 25 mmol/L (ref 22–32)
Calcium: 8.3 mg/dL — ABNORMAL LOW (ref 8.9–10.3)
Chloride: 101 mmol/L (ref 101–111)
Creatinine, Ser: 0.9 mg/dL (ref 0.61–1.24)
GFR calc Af Amer: >60 mL/min (ref >60)
GFR calc non Af Amer: >60 mL/min (ref >60)
Glucose, Bld: 121 mg/dL — ABNORMAL HIGH (ref 65–99)
Potassium: 4.2 mmol/L (ref 3.5–5.1)
Sodium: 133 mmol/L — ABNORMAL LOW (ref 135–145)

## 2015-09-16 MED ORDER — NICOTINE 14 MG/24HR TD PT24
14.0000 mg | MEDICATED_PATCH | Freq: Every day | TRANSDERMAL | Status: DC
  Administered 2015-09-16 – 2015-09-18 (×3): 14 mg via TRANSDERMAL
  Filled 2015-09-16 (×3): qty 1

## 2015-09-16 MED ORDER — GUAIFENESIN ER 600 MG PO TB12
600.0000 mg | ORAL_TABLET | Freq: Two times a day (BID) | ORAL | Status: DC
  Administered 2015-09-16 – 2015-09-18 (×5): 600 mg via ORAL
  Filled 2015-09-16 (×5): qty 1

## 2015-09-16 MED ORDER — FUROSEMIDE 40 MG PO TABS
40.0000 mg | ORAL_TABLET | Freq: Every day | ORAL | Status: DC
  Administered 2015-09-16: 40 mg via ORAL
  Filled 2015-09-16: qty 1

## 2015-09-16 MED ORDER — SODIUM CHLORIDE 0.9% FLUSH
3.0000 mL | Freq: Two times a day (BID) | INTRAVENOUS | Status: DC
  Administered 2015-09-16 – 2015-09-18 (×4): 3 mL via INTRAVENOUS

## 2015-09-16 MED ORDER — POTASSIUM CHLORIDE CRYS ER 20 MEQ PO TBCR
20.0000 meq | EXTENDED_RELEASE_TABLET | Freq: Every day | ORAL | Status: DC
  Administered 2015-09-16 – 2015-09-18 (×3): 20 meq via ORAL
  Filled 2015-09-16 (×3): qty 1

## 2015-09-16 MED ORDER — SODIUM CHLORIDE 0.9% FLUSH: 3.0000 mL | INTRAVENOUS | Status: DC | PRN

## 2015-09-16 MED ORDER — SODIUM CHLORIDE 0.9 % IV SOLN: 250.0000 mL | INTRAVENOUS | Status: DC | PRN

## 2015-09-16 MED ORDER — MOVING RIGHT ALONG BOOK
Freq: Once | Status: AC
  Administered 2015-09-16: 18:00:00
  Filled 2015-09-16: qty 1

## 2015-09-16 NOTE — Progress Notes (Signed)
CARDIAC REHAB PHASE I   PRE:  Rate/Rhythm: 78 SR  BP:  Sitting: 100/67        SaO2: 91 2L  MODE:  Ambulation: 150 ft   POST:  Rate/Rhythm: 91 SR  BP:  Sitting: 125/66         SaO2: 91 2L  Pt in bed on O2, sats 89-91% on 2L. Pt agreeable to walk. Pt required moderate assistance to transition from lying to sitting position, verbal cues for sternal precautions. Pt ambulated 150 ft on 2L O2, rolling walker, assist x1, fairly steady gait, tolerated fairly well. Pt c/o mild DOE, denies any other complaints, brief standing rest x1. Pt required assistance to return to bed. Pt generally weak, shaky, states he is tired this afternoon. Pt to bed after walk, call bell within reach. Will follow.   [phone] Joylene GrapesEmily C Oris Calmes, RN, BSN 09/16/2015 2:11 PM

## 2015-09-16 NOTE — Discharge Instructions (Signed)
Coronary Artery Bypass Grafting, Care After °Refer to this sheet in the next few weeks. These instructions provide you with information on caring for yourself after your procedure. Your health care provider may also give you more specific instructions. Your treatment has been planned according to current medical practices, but problems sometimes occur. Call your health care provider if you have any problems or questions after your procedure. °WHAT TO EXPECT AFTER THE PROCEDURE °Recovery from surgery will be different for everyone. Some people feel well after 3 or 4 weeks, while for others it takes longer. After your procedure, it is typical to have the following: °· Nausea and a lack of appetite.   °· Constipation. °· Weakness and fatigue.   °· Depression or irritability.   °· Pain or discomfort at your incision site. °HOME CARE INSTRUCTIONS °· Take medicines only as directed by your health care provider. Do not stop taking medicines or start any new medicines without first checking with your health care provider. °· Take your pulse as directed by your health care provider. °· Perform deep breathing as directed by your health care provider. If you were given a device called an incentive spirometer, use it to practice deep breathing several times a day. Support your chest with a pillow or your arms when you take deep breaths or cough. °· Keep incision areas clean, dry, and protected. Remove or change any bandages (dressings) only as directed by your health care provider. You may have skin adhesive strips over the incision areas. Do not take the strips off. They will fall off on their own. °· Check incision areas daily for any swelling, redness, or drainage. °· If incisions were made in your legs, do the following: °¨ Avoid crossing your legs.   °¨ Avoid sitting for long periods of time. Change positions every 30 minutes.   °¨ Elevate your legs when you are sitting. °· Wear compression stockings as directed by your  health care provider. These stockings help keep blood clots from forming in your legs. °· Take showers once your health care provider approves. Until then, only take sponge baths. Pat incisions dry. Do not rub incisions with a washcloth or towel. Do not take baths, swim, or use a hot tub until your health care provider approves. °· Eat foods that are high in fiber, such as raw fruits and vegetables, whole grains, beans, and nuts. Meats should be lean cut. Avoid canned, processed, and fried foods. °· Drink enough fluid to keep your urine clear or pale yellow. °· Weigh yourself every day. This helps identify if you are retaining fluid that may make your heart and lungs work harder. °· Rest and limit activity as directed by your health care provider. You may be instructed to: °¨ Stop any activity at once if you have chest pain, shortness of breath, irregular heartbeats, or dizziness. Get help right away if you have any of these symptoms. °¨ Move around frequently for short periods or take short walks as directed by your health care provider. Increase your activities gradually. You may need physical therapy or cardiac rehabilitation to help strengthen your muscles and build your endurance. °¨ Avoid lifting, pushing, or pulling anything heavier than 10 lb (4.5 kg) for at least 6 weeks after surgery. °· Do not drive until your health care provider approves.  °· Ask your health care provider when you may return to work. °· Ask your health care provider when you may resume sexual activity. °· Keep all follow-up visits as directed by your health care   provider. This is important. °SEEK MEDICAL CARE IF: °· You have swelling, redness, increasing pain, or drainage at the site of an incision. °· You have a fever. °· You have swelling in your ankles or legs. °· You have pain in your legs.   °· You gain 2 or more pounds (0.9 kg) a day. °· You are nauseous or vomit. °· You have diarrhea.  °SEEK IMMEDIATE MEDICAL CARE IF: °· You have  chest pain that goes to your jaw or arms. °· You have shortness of breath.   °· You have a fast or irregular heartbeat.   °· You notice a "clicking" in your breastbone (sternum) when you move.   °· You have numbness or weakness in your arms or legs. °· You feel dizzy or light-headed.   °MAKE SURE YOU: °· Understand these instructions. °· Will watch your condition. °· Will get help right away if you are not doing well or get worse. °  °This information is not intended to replace advice given to you by your health care provider. Make sure you discuss any questions you have with your health care provider. °  °Document Released: 09/24/2004 Document Revised: 03/28/2014 Document Reviewed: 08/14/2012 °Elsevier Interactive Patient Education ©2016 Elsevier Inc. ° °

## 2015-09-16 NOTE — Discharge Summary (Signed)
Physician Discharge Summary       [address]       [place] [address]             [phone]    Patient ID: Adam Vega MRN: [id number] DOB/AGE: Feb 16, 1961 55 y.o.  Admit date: 09/11/2015 Discharge date: 09/18/2015  Admission Diagnoses: 1. S/p NSTEMI (non-ST elevated myocardial infarction) (HCC) 2. Coronary artery disease  Active Diagnoses:  1. Tobacco abuse 2. ABL anemia  Procedure (s):  Left Heart Cath and Coronary Angiography by Dr. Katrinka Blazing on 09/11/2015:    Conclusion    1. Ost RCA to Prox RCA lesion, 100% stenosed. 2. Prox LAD lesion, 100% stenosed. 3. Dist RCA lesion, 100% stenosed.   Total occlusion of the proximal RCA. RCA is dominant giving 3 large inferior wall vessels taken up the territory of the distal circumflex. Left right collaterals are noted.  Total occlusion of the proximal to mid LAD beyond the origin of the first elbow perforator. Left left collaterals are noted.  Circumflex is relatively small and widely patent..   The large branching ramus intermedius is widely patent and supplies many of the collaterals to both RCA and LAD.  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 LVEDP is mildly elevated.  RECOMMENDATIONS:   Resume IV heparin  Low-dose beta blocker therapy as tolerated  TCTS consult, Dr. Tyrone Sage has been notified.     TRANSESOPHAGEAL ECHOCARDIOGRAM (TEE), MEDIAN STERNOTOMY for CORONARY ARTERY BYPASS GRAFTING (CABG) times 2 using left internal mammary to left anterior descending coronary artery and right greater saphenous to distal right coronary artery, biopsy of peri- aortic lymoh node by Dr. Tyrone Sage on 09/14/2015.   History of Presenting Illness: [name] who presented to [facility] for evaluation of chest pain and was found to have an elevated troponin of 1.63. He was transferred to [facility] for further evaluation.  Patient  notesintermittent episodes of chest discomfort for the past 2 weeks, lasting for several minutes at a time. He describes the pain as a pressure radiating across his chest and associated with mild dyspnea. He denies nausea, vomiting, or diaphoresis.  On 09/10/2015 he reportedly passed out for several seconds. No prodromal symptoms noted.   He had recurrent chest pressure the mornning of  06/23 and went to [facility] . He has not been seen by a medical provider in years. He is unaware of his current medical diagnoses. He reports smoking 1.5 ppd for 20+ years. He also has a brother who required CABG in his 35's.   While at [facility], labs showed a troponin of 1.63. EKG showed sinus bradycardia with HR of 45 and inferior and lateral TWI. No prior tracings available for comparison. Labs showed a Na+ 137, K+ 4.2, creatinine 1.03, AST 122, ALT 35, INR 0.9. WBC elevated to 12.9, Hgb 14.0, platelets 245.  He was transferred to [facility] for further evaluation and treatment. He underwent a cardiac catheterization on 09/11/2015 and was found to have 100% stenosis of both the LAD and RCA. A cardiothoracic consultation was obtained with Dr. Tyrone Sage or the consideration of coronary artery bypass grafting surgery. Potential risks, benefits, and complications of the surgery were discussed with the patient and he agreed to proceed with surgery.Pre operative carotid duplex showed no significant internal carotid artery stenosis bilaterally. He underwent a CABG x 2 on 09/14/2015.  Brief Hospital Course:  The patient was extubated the evening of surgery  without difficulty. He remained afebrile and hemodynamically stable. He was weaned off Dopamine drip. Theone MurdochSwan Ganz, a line, chest tubes, and foley were removed early in the post operative course. Lopressor was started and titrated accordingly. He was volume over loaded and diuresed. He had ABL anemia. Hedid not require a post op transfusion. His las tH and  H was 9.8 and 29.3. He was weaned off the insulin drip. . The patient's HGA1C pre op was 5.7. He is pre diabetic and will need further surveillance of his HGA1C 5.7 by a medical doctor after discharge. The patient was felt surgically stable for transfer from the ICU to PCTU for further convalescence on 09/16/2015. He continues to progress with cardiac rehab. He was ambulating on 2 liters via [place]. He has been weaned to room air. He has been tolerating a diet and has had a bowel movement. Epicardial pacing wires have already been removed. Chest tube sutures were removed 06/30. His ecasa has been decreased to 81 mg daily and Plavix will be started 75 mg daily (as he is s/p NSTEMI).The patient is felt surgically stable for discharge today.   Latest Vital Signs: Blood pressure 114/98, pulse 79, temperature 98.5 F (36.9 C), temperature source Oral, resp. rate 18, height 5\' 6"  (1.676 m), weight 145 lb 9.6 oz (66.044 kg), SpO2 96 %.   Physical Exam: General appearance: alert, cooperative and no distress Neurologic: intact Heart: RRR, no murmur Lungs: Slightly diminished at bases Abdomen: soft, non-tender; bowel sounds normal; no masses, no organomegaly Extremities: Minor LE edema Wound: Aquacel intact;Right lower leg wound is clean and dry  Discharge Condition:Stable and discharged to home.  Recent laboratory studies:  Lab Results  Component Value Date   WBC 14.2* 09/17/2015   HGB 9.8* 09/17/2015   HCT 29.3* 09/17/2015   MCV 86.2 09/17/2015   PLT 178 09/17/2015   Lab Results  Component Value Date   NA 135 09/17/2015   K 4.3 09/17/2015   CL 101 09/17/2015   CO2 26 09/17/2015   CREATININE 1.05 09/17/2015   GLUCOSE 107* 09/17/2015    Diagnostic Studies: EXAM: CHEST 2 VIEW  COMPARISON: 09/16/2015  FINDINGS: Right IJ sheath has been removed. There are persistent bilateral pleural effusions. No pneumothorax. Status post median sternotomy and CABG. Heart is mildly enlarged. Suspect  mild interstitial edema. However, overall aeration appears slightly improved with better lung volumes.  IMPRESSION: 1. Stable bilateral pleural effusions. 2. Suspect mild interstitial edema.   Electronically Signed  By: Norva PavlovElizabeth Brown M.D.  On: 09/17/2015 10:58       Discharge Instructions    AMB Referral to Cardiac Rehabilitation - Phase II    Complete by:  As directed   Diagnosis:  CABG  CABG X ___:  2     Amb Referral to Cardiac Rehabilitation    Complete by:  As directed   Diagnosis:   CABG Comment - To [place] NSTEMI    CABG X ___:  2           Discharge Medications:   Medication List    TAKE these medications        aspirin 81 MG EC tablet  Take 4 tablets (325 mg total) by mouth daily.     atorvastatin 80 MG tablet  Commonly known as:  LIPITOR  Take 1 tablet (80 mg total) by mouth daily at 6 PM.     clopidogrel 75 MG tablet  Commonly known as:  PLAVIX  Take 1 tablet (75 mg total)  by mouth daily.     furosemide 40 MG tablet  Commonly known as:  LASIX  Take 1 tablet (40 mg total) by mouth daily. For 5 days then stop.     metoprolol tartrate 25 MG tablet  Commonly known as:  LOPRESSOR  Take 0.5 tablets (12.5 mg total) by mouth 2 (two) times daily.     nicotine 14 mg/24hr patch  Commonly known as:  NICODERM CQ - dosed in mg/24 hours  Place 1 patch (14 mg total) onto the skin daily.     oxyCODONE 5 MG immediate release tablet  Commonly known as:  Oxy IR/ROXICODONE  Take 1-2 tablets (5-10 mg total) by mouth every 4 (four) hours as needed for severe pain.     potassium chloride SA 20 MEQ tablet  Commonly known as:  K-DUR,KLOR-CON  Take 1 tablet (20 mEq total) by mouth daily. For 5 days then stop.       The patient has been discharged on:   1.Beta Blocker:  Yes [  x ]                              No   [   ]                              If No, reason:  2.Ace Inhibitor/ARB: Yes [   ]                                     No  [  x  ]                                      If No, reason: Labile BP  3.Statin:   Yes [ x  ]                  No  [   ]                  If No, reason:  4.Ecasa:  Yes  [  x ]                  No   [   ]                  If No, reason:  Follow Up Appointments: Follow-up Information    Follow up with Dina RichBranch, Jonathan, MD On 10/01/2015.   Specialty:  Cardiology   Why:  Appointment time is at 1:20 pm   Contact information:   [address] [place] [address] [phone]       Follow up with Delight OvensEdward B Gerhardt, MD On 10/15/2015.   Specialty:  Cardiothoracic Surgery   Why:  PA[facility]LAT CX[facility] which is in the same building as Dr. Dennie MaizesGerhardt's office) on at;Appointment time is at   Contact information:   [address] [place] [address] [phone]       Follow up with Medical Doctor.   Why:  Please obtain a medical doctor after discharge for further surveillance of HGA1C 5.7 (pre diabetes)      Signed: Aine Strycharz MPA-C 09/18/2015, 1:43 PM

## 2015-09-16 NOTE — Progress Notes (Signed)
Spoke to nurse caring for this patient about the central IJ being removed, she states she will remove this line. Adam Vega, [name] M

## 2015-09-16 NOTE — Progress Notes (Signed)
[place] [facility]. Pt connected to tele monitor. Vitals stable. Oriented to room. Call bell and phone within reach.

## 2015-09-16 NOTE — Significant Event (Signed)
Patient ambulated to 2W21, tolerated the walk well. No personal belongings at bedside of 2S08 to take to new room. Patient's spouse called and updated. Report given to receiving RN in 2W. Staff in room prior to RN leaving. Karolee Meloni, Charity fundraiserN.

## 2015-09-16 NOTE — Progress Notes (Addendum)
TCTS DAILY ICU PROGRESS NOTE                   [address]            [place] [id number]          [phone]   2 Days Post-Op Procedure(s) (LRB): CORONARY ARTERY BYPASS GRAFTING (CABG) times two using left internal mammary to left anterior descending coronary artery and right greater saphenous to distal right coronary artery.  Biopsy of peri- aortic lymoh node. (N/A) TRANSESOPHAGEAL ECHOCARDIOGRAM (TEE) (N/A)  Total Length of Stay:  LOS: 5 days   Subjective: Patient's only complaint is "wanting to wash up".  Objective: Vital signs in last 24 hours: Temp:  [98.1 F (36.7 C)-99.1 F (37.3 C)] 98.8 F (37.1 C) (06/28 0400) Pulse Rate:  [56-105] 80 (06/28 0700) Cardiac Rhythm:  [-] Normal sinus rhythm (06/28 0400) Resp:  [17-32] 32 (06/28 0700) BP: (94-119)/(63-79) 113/69 mmHg (06/28 0700) SpO2:  [89 %-100 %] 93 % (06/28 0700) Arterial Line BP: (100-112)/(64-65) 112/65 mmHg (06/27 0800) FiO2 (%):  [28 %] 28 % (06/27 1226) Weight:  [154 lb 1.6 oz (69.9 kg)] 154 lb 1.6 oz (69.9 kg) (06/28 0500)  Filed Weights   09/14/15 0530 09/15/15 0500 09/16/15 0500  Weight: 143 lb (64.864 kg) 152 lb 9.6 oz (69.219 kg) 154 lb 1.6 oz (69.9 kg)    Weight change: 1 lb 8 oz (0.681 kg)   Hemodynamic parameters for last 24 hours: PAP: (34-39)/(14-19) 34/14 mmHg  Intake/Output from previous day: 06/27 0701 - 06/28 0700 In: 1213 [P.O.:600; I.V.:463; IV Piggyback:150] Out: 685 [Urine:685]  Intake/Output this shift:    Current Meds: Scheduled Meds: . acetaminophen  1,000 mg Oral Q6H   Or  . acetaminophen (TYLENOL) oral liquid 160 mg/5 mL  1,000 mg Per Tube Q6H  . antiseptic oral rinse  7 mL Mouth Rinse BID  . aspirin EC  325 mg Oral Daily   Or  . aspirin  324 mg Per Tube Daily  . atorvastatin  80 mg Oral q1800  . bisacodyl  10 mg Oral Daily   Or  . bisacodyl  10 mg Rectal Daily  . docusate sodium  200 mg Oral Daily  . insulin aspart  0-24 Units Subcutaneous Q4H    . levalbuterol  0.63 mg Nebulization QID  . metoprolol tartrate  12.5 mg Oral BID   Or  . metoprolol tartrate  12.5 mg Per Tube BID  . pantoprazole  40 mg Oral Daily  . sodium chloride flush  3 mL Intravenous Q12H   Continuous Infusions: . sodium chloride Stopped (09/15/15 0900)  . sodium chloride    . sodium chloride Stopped (09/15/15 0900)  . dexmedetomidine Stopped (09/14/15 1800)  . lactated ringers Stopped (09/15/15 0900)  . lactated ringers Stopped (09/16/15 0400)  . nitroGLYCERIN Stopped (09/14/15 1300)  . phenylephrine (NEO-SYNEPHRINE) Adult infusion Stopped (09/14/15 2300)   PRN Meds:.sodium chloride, lactated ringers, metoprolol, midazolam, morphine injection, ondansetron (ZOFRAN) IV, oxyCODONE, sodium chloride flush, traMADol  General appearance: alert, cooperative and no distress Neurologic: intact Heart: RRR, no murmur Lungs: Slightly diminished at bases Abdomen: soft, non-tender; bowel sounds normal; no masses,  no organomegaly Extremities: Minor LE edema Wound: Aquacel intact;Right lower leg wound is clean and dry  Lab Results: CBC: Recent Labs  09/15/15 1640 09/15/15 1644 09/16/15 0400  WBC 14.6*  --  14.6*  HGB 10.3* 10.2* 9.9*  HCT 32.3* 30.0* 30.6*  PLT 161  --  153   BMET:  Recent Labs  09/15/15 0434  09/15/15 1644 09/16/15 0400  NA 137  --  135 133*  K 4.4  --  4.4 4.2  CL 107  --  99* 101  CO2 21*  --   --  25  GLUCOSE 122*  --  129* 121*  BUN 6  --  10 10  CREATININE 0.95  < > 0.80 0.90  CALCIUM 7.9*  --   --  8.3*  < > = values in this interval not displayed.  PT/INR:  Recent Labs  09/14/15 1307  LABPROT 17.6*  INR 1.44   Radiology: No results found.   Assessment/Plan: S/P Procedure(s) (LRB): CORONARY ARTERY BYPASS GRAFTING (CABG) times two using left internal mammary to left anterior descending coronary artery and right greater saphenous to distal right coronary artery.  Biopsy of peri- aortic lymoh node.  (N/A) TRANSESOPHAGEAL ECHOCARDIOGRAM (TEE) (N/A)  1.CV-Maintaining SR. On Lopressor 12.5 mg bid. 2. Pulmonary- On 2 liter of oxygen via [place]. Wean to room air as tolertes . CXR this am appears to show no pneumothorax, bibasilar atelectasis. Lymph node biopsy pathology BENIGN. Encourage incentive spirometer. 3. Volume Overload- Will give Lasix 40 mg daily. 4. ABL anemia-H and H stable at 9.9 and 30.6 5. CBGs 138/129/113. Pre op HGA1C 5.7. He is likely pre diabetic. Will stop accu checks and SS PRN on transfer. 6. Transfer to PCTU/2W  Elenore RotaZIMMERMAN,DONIELLE M PA-C 09/16/2015 7:26 AM  Patient seen and examined, agree with above transfer to 2 [employer]  Lenord Fralix C. Dorris FetchHendrickson, MD [facility] [phone]

## 2015-09-17 ENCOUNTER — Inpatient Hospital Stay (HOSPITAL_COMMUNITY): Payer: PRIVATE HEALTH INSURANCE

## 2015-09-17 LAB — CBC
HEMATOCRIT: 29.3 % — AB (ref 39.0–52.0)
HEMOGLOBIN: 9.8 g/dL — AB (ref 13.0–17.0)
MCH: 28.8 pg (ref 26.0–34.0)
MCHC: 33.4 g/dL (ref 30.0–36.0)
MCV: 86.2 fL (ref 78.0–100.0)
Platelets: 178 10*3/uL (ref 150–400)
RBC: 3.4 MIL/uL — AB (ref 4.22–5.81)
RDW: 13.8 % (ref 11.5–15.5)
WBC: 14.2 10*3/uL — ABNORMAL HIGH (ref 4.0–10.5)

## 2015-09-17 LAB — BASIC METABOLIC PANEL
Anion gap: 8 (ref 5–15)
BUN: 8 mg/dL (ref 6–20)
CALCIUM: 8.4 mg/dL — AB (ref 8.9–10.3)
CO2: 26 mmol/L (ref 22–32)
CREATININE: 1.05 mg/dL (ref 0.61–1.24)
Chloride: 101 mmol/L (ref 101–111)
GFR calc Af Amer: >60 mL/min (ref >60)
GFR calc non Af Amer: >60 mL/min (ref >60)
GLUCOSE: 107 mg/dL — AB (ref 65–99)
Potassium: 4.3 mmol/L (ref 3.5–5.1)
SODIUM: 135 mmol/L (ref 135–145)

## 2015-09-17 MED ORDER — FUROSEMIDE 10 MG/ML IJ SOLN
40.0000 mg | Freq: Once | INTRAMUSCULAR | Status: AC
  Administered 2015-09-17: 40 mg via INTRAVENOUS
  Filled 2015-09-17: qty 4

## 2015-09-17 MED ORDER — FUROSEMIDE 40 MG PO TABS
40.0000 mg | ORAL_TABLET | Freq: Every day | ORAL | Status: DC
  Administered 2015-09-18: 40 mg via ORAL
  Filled 2015-09-17: qty 1

## 2015-09-17 NOTE — Progress Notes (Signed)
Patient sitting up in chair, no needs at this time. Call light within reach. 

## 2015-09-17 NOTE — Progress Notes (Signed)
Pt O2 sats on room air were 96%. Pt O2 sats on 2L were 99%. Pt educated on importance of coming off oxygen. Pt stated he wanted to wear oxygen for comfort.

## 2015-09-17 NOTE — Progress Notes (Signed)
CARDIAC REHAB PHASE I   PRE:  Rate/Rhythm: 85 SR  BP:  Sitting: 120/77        SaO2: 94 2L  MODE:  Ambulation: 350 ft   POST:  Rate/Rhythm: 100 SR  BP:  Sitting: 124/72         SaO2: 92 2L  Pt just completed bed rest following EPW removal. Per respiratory therapy, pt sats 87%on 1L O2 at rest, increased to 2L.Pt eager to walk, only complaint is that he has not been sleeping at night. Pt ambulated 350 ft on 2L O2, rolling walker, assist x1, fairly steady gait, tolerated fairly well. Pt c/o DOE, general fatigue, declined rest stop. Encouraged IS, flutter valve, additional ambulation x2 today. Pt to recliner after walk, feet elevated, call bell within reach. Will follow.   [phone] Joylene GrapesEmily C Gilberto Streck, RN, BSN 09/17/2015 12:09 PM

## 2015-09-17 NOTE — Progress Notes (Signed)
Patient found on RA shaving, patient placed back on 2 liters due to spo2 being 84%.

## 2015-09-17 NOTE — Progress Notes (Signed)
Pt ambulated 350 ft with 2L O2. Pt had no complaints of dizziness or shortness of breath. Will continue to monitor pt.

## 2015-09-17 NOTE — Progress Notes (Addendum)
      [address]       [place] [id number]             [phone]        3 Days Post-Op Procedure(s) (LRB): CORONARY ARTERY BYPASS GRAFTING (CABG) times two using left internal mammary to left anterior descending coronary artery and right greater saphenous to distal right coronary artery.  Biopsy of peri- aortic lymoh node. (N/A) TRANSESOPHAGEAL ECHOCARDIOGRAM (TEE) (N/A)  Subjective: Patient does not sleep well in hospital as has had multiple family die in the hospital. "Drugs" don't help him sleep either. He had a large bowel movement this am.  Objective: Vital signs in last 24 hours: Temp:  [97.9 F (36.6 C)-99.2 F (37.3 C)] 97.9 F (36.6 C) (06/29 0529) Pulse Rate:  [71-88] 85 (06/29 0529) Cardiac Rhythm:  [-] Normal sinus rhythm (06/28 2015) Resp:  [16-29] 18 (06/29 0529) BP: (96-125)/(30-86) 112/64 mmHg (06/29 0529) SpO2:  [89 %-97 %] 96 % (06/29 0529) Weight:  [152 lb 4.8 oz (69.083 kg)] 152 lb 4.8 oz (69.083 kg) (06/29 0500)  Pre op weight 65 kg Current Weight  09/17/15 152 lb 4.8 oz (69.083 kg)       Intake/Output from previous day: 06/28 0701 - 06/29 0700 In: 240 [P.O.:240] Out: 300 [Urine:300]   Physical Exam:  Cardiovascular: RRR, no murmur Pulmonary: Diminished at bases; no rales, wheezes, or rhonchi. Abdomen: Soft, non tender, bowel sounds present. Extremities: Mild bilateral lower extremity edema. Wounds: RLE wounds are clean and dry.  No erythema or signs of infection. Aquacel intact.  Lab Results: CBC: Recent Labs  09/16/15 0400 09/17/15 0234  WBC 14.6* 14.2*  HGB 9.9* 9.8*  HCT 30.6* 29.3*  PLT 153 178   BMET:  Recent Labs  09/16/15 0400 09/17/15 0234  NA 133* 135  K 4.2 4.3  CL 101 101  CO2 25 26  GLUCOSE 121* 107*  BUN 10 8  CREATININE 0.90 1.05  CALCIUM 8.3* 8.4*    PT/INR:  Lab Results  Component Value Date   INR 1.44 09/14/2015   INR 1.10 09/13/2015   ABG:  INR: Will add last result for INR,  ABG once components are confirmed Will add last 4 CBG results once components are confirmed  Assessment/Plan:  1. CV - S/p NSTEMI. SR. On Lopressor 12.5 mg bid. BP too labile for ACE. 2.  Pulmonary - On 2 liters via [place]. Wean to room air as tolerates. CXR this am shows low lung volumes, no pneumothorax, small bilateral pleural effusions. Encourage incentive spirometer and flutter valve. 3. Volume Overload - On Lasix 40 mg daily-will give IV this am to help with diuresis. 4.  Acute blood loss anemia - H and H stable at 9.8 and 29.3 5. Remove EPW 6. Possibly home 1-2 days  ZIMMERMAN,DONIELLE MPA-C 09/17/2015,7:22 AM  Poss Home 1-2 days , wean off o2 before dc I have seen and examined Adam Vega and agree with the above assessment  and plan.  Delight OvensEdward B Mccrae Speciale MD Beeper [phone] Office [phone] 09/17/2015 8:31 AM

## 2015-09-18 MED ORDER — NICOTINE 14 MG/24HR TD PT24: 14.0000 mg | MEDICATED_PATCH | Freq: Every day | TRANSDERMAL | Status: DC

## 2015-09-18 MED ORDER — OXYCODONE HCL 5 MG PO TABS: 5.0000 mg | ORAL_TABLET | ORAL | Status: DC | PRN

## 2015-09-18 MED ORDER — LEVALBUTEROL HCL 0.63 MG/3ML IN NEBU
0.6300 mg | INHALATION_SOLUTION | Freq: Three times a day (TID) | RESPIRATORY_TRACT | Status: DC
  Administered 2015-09-18 (×2): 0.63 mg via RESPIRATORY_TRACT
  Filled 2015-09-18 (×2): qty 3

## 2015-09-18 MED ORDER — CLOPIDOGREL BISULFATE 75 MG PO TABS: 75.0000 mg | ORAL_TABLET | Freq: Every day | ORAL | Status: DC

## 2015-09-18 MED ORDER — METOPROLOL TARTRATE 25 MG PO TABS: 12.5000 mg | ORAL_TABLET | Freq: Two times a day (BID) | ORAL | Status: DC

## 2015-09-18 MED ORDER — ASPIRIN 81 MG PO TBEC: 325.0000 mg | DELAYED_RELEASE_TABLET | Freq: Every day | ORAL | Status: DC

## 2015-09-18 MED ORDER — POTASSIUM CHLORIDE CRYS ER 20 MEQ PO TBCR: 20.0000 meq | EXTENDED_RELEASE_TABLET | Freq: Every day | ORAL | Status: DC

## 2015-09-18 MED ORDER — FUROSEMIDE 40 MG PO TABS: 40.0000 mg | ORAL_TABLET | Freq: Every day | ORAL | Status: DC

## 2015-09-18 MED ORDER — ATORVASTATIN CALCIUM 80 MG PO TABS: 80.0000 mg | ORAL_TABLET | Freq: Every day | ORAL | Status: DC

## 2015-09-18 NOTE — Care Management Note (Addendum)
Case Management Note Previous Adam Vega note initiated by Raynald BlendSamantha Claxton Vega, Adam Vega  Patient Details  Name: Adam Vega MRN: [id number] Date of Birth: 1960-10-29  Subjective/Objective:    Admitted with NSTEMI-  Pt is s/p CABG              Action/Plan:  PTA independent from home with wife.  Wife will provide 24 hour supervision as recommended.  Adam Vega will continue to monitor for discharge needs  09/16/15- Adam Vega, Adam Vega- pt tx from ICU to 2W- Adam Vega to continue to follow for d/c needs  Expected Discharge Date:  09/21/15               Expected Discharge Plan:  Home/Self Care  In-House Referral:     Discharge planning Services  Adam Vega Consult  Post Acute Care Choice:    Choice offered to:     DME Arranged:    DME Agency:     HH Arranged:    HH Agency:     Status of Service:  In process, will continue to follow  If discussed at Long Length of Stay Meetings, dates discussed:    Additional Comments:  09/18/15- 1145- Adam Vega, Adam Vega- per cardiac rehab- pt would like a RW for home on discharge- MD please place DME order for RW- will then arrange to have one delivered to room prior to discharge.  No other Adam Vega needs noted- plan to d/c home with wife.   Zenda AlpersWebster, AshlandKristi Hall, Vega 09/18/2015, 11:45 AM [phone]

## 2015-09-18 NOTE — Progress Notes (Signed)
CARDIAC REHAB PHASE I   PRE:  Rate/Rhythm: 81 SR  BP:  Sitting: 108/79        SaO2: 98 RA  MODE:  Ambulation: 550 ft   POST:  Rate/Rhythm: 89 SR  BP:  Sitting: 128/81         SaO2: 94 RA  Pt ambulated 550 ft on RA, rolling walker, assist x1, slow, steady gait, tolerated well with no complaints, brief standing rest x2. Pt sats 94-96% on RA. Pt would benefit from rolling walker for home use, case manager notified. Cardiac surgery discharge education completed. Reviewed risk factors, tobacco cessation (gave pt fake cigarette), IS, sternal precautions, activity progression, exercise, heart healthy, sodium restrictions, daily weights and phase 2 cardiac rehab. Pt verbalized understanding, very receptive to education, states he is prepared to quit smoking. Pt agrees to phase 2 cardiac rehab referral, [facility]. Encouraged IS, flutter valve, additional ambulation today. Pt to recliner after walk, call bell within reach. Will follow-up tomorrow if pt does not discharge today.   [phone] Joylene GrapesEmily C Kayode Petion, RN, BSN 09/18/2015 11:26 AM

## 2015-09-18 NOTE — Progress Notes (Addendum)
      [address]       [place] [id number]             [phone]        4 Days Post-Op Procedure(s) (LRB): CORONARY ARTERY BYPASS GRAFTING (CABG) times two using left internal mammary to left anterior descending coronary artery and right greater saphenous to distal right coronary artery.  Biopsy of peri- aortic lymoh node. (N/A) TRANSESOPHAGEAL ECHOCARDIOGRAM (TEE) (N/A)  Subjective: Patient without complaints. He wants to go home as soon as able.  Objective: Vital signs in last 24 hours: Temp:  [97.3 F (36.3 C)-98.5 F (36.9 C)] 98.5 F (36.9 C) (06/30 0503) Pulse Rate:  [79-98] 79 (06/30 0503) Cardiac Rhythm:  [-] Normal sinus rhythm (06/29 1900) Resp:  [16-20] 18 (06/30 0503) BP: (107-114)/(63-98) 114/98 mmHg (06/30 0503) SpO2:  [84 %-98 %] 95 % (06/30 0503) Weight:  [145 lb 9.6 oz (66.044 kg)] 145 lb 9.6 oz (66.044 kg) (06/30 0503)  Pre op weight 65 kg Current Weight  09/18/15 145 lb 9.6 oz (66.044 kg)       Intake/Output from previous day: 06/29 0701 - 06/30 0700 In: 180 [P.O.:180] Out: 800 [Urine:800]   Physical Exam:  Cardiovascular: RRR, no murmur Pulmonary: Diminished at bases; no rales, wheezes, or rhonchi. Abdomen: Soft, non tender, bowel sounds present. Extremities: Mild bilateral lower extremity edema. Wounds: RLE wounds are clean and dry.  No erythema or signs of infection. Aquacel intact.  Lab Results: CBC:  Recent Labs  09/16/15 0400 09/17/15 0234  WBC 14.6* 14.2*  HGB 9.9* 9.8*  HCT 30.6* 29.3*  PLT 153 178   BMET:   Recent Labs  09/16/15 0400 09/17/15 0234  NA 133* 135  K 4.2 4.3  CL 101 101  CO2 25 26  GLUCOSE 121* 107*  BUN 10 8  CREATININE 0.90 1.05  CALCIUM 8.3* 8.4*    PT/INR:  Lab Results  Component Value Date   INR 1.44 09/14/2015   INR 1.10 09/13/2015   ABG:  INR: Will add last result for INR, ABG once components are confirmed Will add last 4 CBG results once components are  confirmed  Assessment/Plan:  1. CV - S/p NSTEMI. SR. On Lopressor 12.5 mg bid. BP too labile for ACE. 2.  Pulmonary - On 2 liters via [place]. Continue to try and wean to room air as tolerates. . Encourage incentive spirometer and flutter valve. 3. Volume Overload - On Lasix 40 mg daily. 4.  Acute blood loss anemia - H and H stable at 9.8 and 29.3 5. Has been weaned to room air. As discussed with Dr. Tyrone SageGerhardt, will decrease ecasa to 81 mg daily and start Plavix as s// NSTEMI. Will dishcarge today.  ZIMMERMAN,Adam Vega 09/18/2015,7:28 AM   Plan d/c today  Path on lymph node mediastinal benign I have seen and examined Adam Vega and agree with the above assessment  and plan.  Delight OvensEdward B Chin Wachter MD Beeper [phone] Office [phone] 09/18/2015 5:47 PM

## 2015-09-24 ENCOUNTER — Other Ambulatory Visit: Payer: Self-pay | Admitting: *Deleted

## 2015-09-24 DIAGNOSIS — G8918 Other acute postprocedural pain: Secondary | ICD-10-CM

## 2015-09-24 MED ORDER — OXYCODONE HCL 5 MG PO TABS: 5.0000 mg | ORAL_TABLET | ORAL | Status: DC | PRN

## 2015-10-01 ENCOUNTER — Ambulatory Visit (INDEPENDENT_AMBULATORY_CARE_PROVIDER_SITE_OTHER): Payer: PRIVATE HEALTH INSURANCE | Admitting: Cardiology

## 2015-10-01 ENCOUNTER — Encounter: Payer: Self-pay | Admitting: Cardiology

## 2015-10-01 VITALS — BP 105/68 | HR 64 | Ht 66.0 in | Wt 137.0 lb

## 2015-10-01 DIAGNOSIS — I2510 Atherosclerotic heart disease of native coronary artery without angina pectoris: Secondary | ICD-10-CM

## 2015-10-01 DIAGNOSIS — I214 Non-ST elevation (NSTEMI) myocardial infarction: Secondary | ICD-10-CM | POA: Diagnosis not present

## 2015-10-01 DIAGNOSIS — I5022 Chronic systolic (congestive) heart failure: Secondary | ICD-10-CM | POA: Diagnosis not present

## 2015-10-01 MED ORDER — ATORVASTATIN CALCIUM 80 MG PO TABS: 80.0000 mg | ORAL_TABLET | Freq: Every day | ORAL | Status: DC

## 2015-10-01 MED ORDER — CLOPIDOGREL BISULFATE 75 MG PO TABS: 75.0000 mg | ORAL_TABLET | Freq: Every day | ORAL | Status: DC

## 2015-10-01 MED ORDER — METOPROLOL TARTRATE 25 MG PO TABS: 12.5000 mg | ORAL_TABLET | Freq: Two times a day (BID) | ORAL | Status: DC

## 2015-10-01 NOTE — Progress Notes (Signed)
Clinical Summary Adam Vega is a 55 y.o.male seen today for hospital follow up, this is our first visit together.  1. CAD/ICM/Chronic systolic HF - admitted 08/2015 with chest pain, NSTEMI, peak trop 25.  - cath showed occluded LAD and RCA - 09/14/15 CABG with LIMA-LAD, SVG-RCA. - do not see echo in system. LVEF by LV gram 35-45% - soft bp's limited medical therapy  - denies any chest pain. No SOB or DOE - compliant with meds. He remains on DAPT s/p ACS and CABG       Past Medical History  Diagnosis Date  . Tobacco user      Allergies  Allergen Reactions  . Amoxicillin Other (See Comments)    Patient just stated that he got very sick .   Marland Kitchen Penicillins Other (See Comments)    Patient just stated that he got very sick     Current Outpatient Prescriptions  Medication Sig Dispense Refill  . aspirin EC 81 MG EC tablet Take 4 tablets (325 mg total) by mouth daily.    Marland Kitchen atorvastatin (LIPITOR) 80 MG tablet Take 1 tablet (80 mg total) by mouth daily at 6 PM. 30 tablet 1  . clopidogrel (PLAVIX) 75 MG tablet Take 1 tablet (75 mg total) by mouth daily. 30 tablet 1  . furosemide (LASIX) 40 MG tablet Take 1 tablet (40 mg total) by mouth daily. For 5 days then stop. 5 tablet 0  . metoprolol tartrate (LOPRESSOR) 25 MG tablet Take 0.5 tablets (12.5 mg total) by mouth 2 (two) times daily. 30 tablet 1  . nicotine (NICODERM CQ - DOSED IN MG/24 HOURS) 14 mg/24hr patch Place 1 patch (14 mg total) onto the skin daily. 28 patch 0  . oxyCODONE (OXY IR/ROXICODONE) 5 MG immediate release tablet Take 1-2 tablets (5-10 mg total) by mouth every 4 (four) hours as needed for severe pain. 40 tablet 0  . potassium chloride SA (K-DUR,KLOR-CON) 20 MEQ tablet Take 1 tablet (20 mEq total) by mouth daily. For 5 days then stop. 5 tablet 0   No current facility-administered medications for this visit.     Past Surgical History  Procedure Laterality Date  . Cardiac catheterization N/A 09/11/2015   Procedure: Left Heart Cath and Coronary Angiography;  Surgeon: Lyn Records, MD;  Location: [facility] INVASIVE CV LAB;  Service: Cardiovascular;  Laterality: N/A;  . Coronary artery bypass graft N/A 09/14/2015    Procedure: CORONARY ARTERY BYPASS GRAFTING (CABG) times two using left internal mammary to left anterior descending coronary artery and right greater saphenous to distal right coronary artery.  Biopsy of peri- aortic lymoh node.;  Surgeon: Delight Ovens, MD;  Location[facility] OR;  Service: Open Heart Surgery;  Laterality: N/A;  . Tee without cardioversion N/A 09/14/2015    Procedure: TRANSESOPHAGEAL ECHOCARDIOGRAM (TEE);  Surgeon: Delight Ovens, MD;  Location: [facility] OR;  Service: Open Heart Surgery;  Laterality: N/A;     Allergies  Allergen Reactions  . Amoxicillin Other (See Comments)    Patient just stated that he got very sick .   Marland Kitchen Penicillins Other (See Comments)    Patient just stated that he got very sick      Family History  Problem Relation Age of Onset  . Heart attack Brother     a. CABG in his 63's     Social History Adam Vega reports that he has been smoking Cigarettes.  He has a 45 pack-year smoking history. He does not have  any smokeless tobacco history on file. Adam Vega has no alcohol history on file.   Review of Systems CONSTITUTIONAL: No weight loss, fever, chills, weakness or fatigue.  HEENT: Eyes: No visual loss, blurred vision, double vision or yellow sclerae.No hearing loss, sneezing, congestion, runny nose or sore throat.  SKIN: No rash or itching.  CARDIOVASCULAR: per HPI  RESPIRATORY: No shortness of breath, cough or sputum.  GASTROINTESTINAL: No anorexia, nausea, vomiting or diarrhea. No abdominal pain or blood.  GENITOURINARY: No burning on urination, no polyuria NEUROLOGICAL: No headache, dizziness, syncope, paralysis, ataxia, numbness or tingling in the extremities. No change in bowel or bladder control.  MUSCULOSKELETAL: No muscle, back  pain, joint pain or stiffness.  LYMPHATICS: No enlarged nodes. No history of splenectomy.  PSYCHIATRIC: No history of depression or anxiety.  ENDOCRINOLOGIC: No reports of sweating, cold or heat intolerance. No polyuria or polydipsia.  Marland Kitchen.   Physical Examination Filed Vitals:   10/01/15 1314  BP: 105/68  Pulse: 64   Filed Vitals:   10/01/15 1314  Height: 5\' 6"  (1.676 m)  Weight: 137 lb (62.143 kg)    Gen: resting comfortably, no acute distress HEENT: no scleral icterus, pupils equal round and reactive, no palptable cervical adenopathy,  CV: RRR, no m/r/g, no jvd Resp: Clear to auscultation bilaterally GI: abdomen is soft, non-tender, non-distended, normal bowel sounds, no hepatosplenomegaly MSK: extremities are warm, no edema.  Skin: warm, no rash Neuro:  no focal deficits Psych: appropriate affect   Diagnostic Studies 08/2015 Cath 1. Ost RCA to Prox RCA lesion, 100% stenosed. 2. Prox LAD lesion, 100% stenosed. 3. Dist RCA lesion, 100% stenosed.   Total occlusion of the proximal RCA. RCA is dominant giving 3 large inferior wall vessels taken up the territory of the distal circumflex. Left right collaterals are noted.  Total occlusion of the proximal to mid LAD beyond the origin of the first elbow perforator. Left left collaterals are noted.  Circumflex is relatively small and widely patent..   The large branching ramus intermedius is widely patent and supplies many of the collaterals to both RCA and LAD.  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 LVEDP is mildly elevated.  RECOMMENDATIONS:   Resume IV heparin  Low-dose beta blocker therapy as tolerated  TCTS consult, Dr. Tyrone SageGerhardt has been notified.    Assessment and Plan   1. CAD/ICM/Chronic systolic HF - s/p recent CABG. No current symptoms. EKG in clinic shows SR, chronic lateral TWIs - medical therapy limited by soft bp's, has not been on ACE-I. Will not start  today - DAPT until 08/2016 in setting of ACS and CABG - repeat echo mid August to evalute LVEF after revascularization - cardiac rehab once clearted by CT surgery.  F/u 3 months     Antoine PocheJonathan F. Demeshia Sherburne, M.D.

## 2015-10-01 NOTE — Patient Instructions (Signed)
Your physician wants you to follow-up in: 3 MONTHS WITH DR. BRANCH  Your physician recommends that you continue on your current medications as directed. Please refer to the Current Medication list given to you today.  Your physician has requested that you have an echocardiogram. Echocardiography is a painless test that uses sound waves to create images of your heart. It provides your doctor with information about the size and shape of your heart and how well your heart's chambers and valves are working. This procedure takes approximately one hour. There are no restrictions for this procedure.   Thank you for choosing [facility]!!

## 2015-10-12 ENCOUNTER — Other Ambulatory Visit: Payer: Self-pay | Admitting: Cardiothoracic Surgery

## 2015-10-12 DIAGNOSIS — Z951 Presence of aortocoronary bypass graft: Secondary | ICD-10-CM

## 2015-10-13 ENCOUNTER — Encounter: Payer: Self-pay | Admitting: Cardiothoracic Surgery

## 2015-10-13 ENCOUNTER — Ambulatory Visit (INDEPENDENT_AMBULATORY_CARE_PROVIDER_SITE_OTHER): Payer: Self-pay | Admitting: Cardiothoracic Surgery

## 2015-10-13 ENCOUNTER — Ambulatory Visit
Admission: RE | Admit: 2015-10-13 | Discharge: 2015-10-13 | Disposition: A | Discharge status: Home / Self Care | Payer: PRIVATE HEALTH INSURANCE | Source: Ambulatory Visit | Attending: Cardiothoracic Surgery | Admitting: Cardiothoracic Surgery

## 2015-10-13 VITALS — BP 99/64 | HR 55 | Resp 16 | Ht 66.0 in | Wt 137.0 lb

## 2015-10-13 DIAGNOSIS — Z951 Presence of aortocoronary bypass graft: Secondary | ICD-10-CM

## 2015-10-13 NOTE — Progress Notes (Signed)
[address]       [place] [address]             [phone]      Adam Vega [facility] Medical Record #[id number] Date of Birth: 08-17-1960  Referring: [place], Peter M, MD Primary Care: Arma Heading, MD  Chief Complaint:   POST OP FOLLOW UP 09/14/2015  OPERATIVE REPORT PREOPERATIVE DIAGNOSIS:  Recent non-ST segment elevation myocardial infarction with coronary occlusive disease. POSTOPERATIVE DIAGNOSIS:  Recent non-ST segment elevation myocardial infarction with coronary occlusive disease. SURGICAL PROCEDURE:  Coronary artery bypass grafting x2, with left internal mammary to the left anterior descending coronary artery and reverse saphenous vein graft to the distal right coronary artery. Biopsy of Ascending Periaortic Lymph Node  SURGEON:  Sheliah Plane, MD.   History of Present Illness:     Patient doing well postoperatively, now up to walking several miles a day without recurrent angina. Since discharge he is stop smoking and remained tobacco free.    Past Medical History:  Diagnosis Date  . Tobacco user      History  Smoking Status  . Former Smoker  . Packs/day: 1.50  . Years: 30.00  . Types: Cigarettes  . Start date: 02/08/1975  . Quit date: 09/11/2015  Smokeless Tobacco  . Never Used    History  Alcohol use Not on file     Allergies  Allergen Reactions  . Amoxicillin Other (See Comments)    Patient just stated that he got very sick .   Marland Kitchen Penicillins Other (See Comments)    Patient just stated that he got very sick    Current Outpatient Prescriptions  Medication Sig Dispense Refill  . aspirin EC 81 MG EC tablet Take 4 tablets (325 mg total) by mouth daily.    Marland Kitchen atorvastatin (LIPITOR) 80 MG tablet Take 1 tablet (80 mg total) by mouth daily at 6 PM. 30 tablet 3  . clopidogrel (PLAVIX) 75 MG tablet Take 1 tablet (75 mg total) by mouth daily. 30 tablet 3  . metoprolol tartrate (LOPRESSOR) 25 MG tablet Take 0.5  tablets (12.5 mg total) by mouth 2 (two) times daily. 30 tablet 3  . oxyCODONE (OXY IR/ROXICODONE) 5 MG immediate release tablet Take 1-2 tablets (5-10 mg total) by mouth every 4 (four) hours as needed for severe pain. 40 tablet 0   No current facility-administered medications for this visit.        Physical Exam: Ht  (1.676 m)   Wt 137 lb (62.1 kg)   BMI 22.11 kg/m   General appearance: alert and cooperative Neurologic: intact Heart: regular rate and rhythm, S1, S2 normal, no murmur, click, rub or gallop Lungs: clear to auscultation bilaterally Abdomen: soft, non-tender; bowel sounds normal; no masses,  no organomegaly Extremities: extremities normal, atraumatic, no cyanosis or edema and Homans sign is negative, no sign of DVT Wound: Sternum is stable and well-healed, right leg vein harvest sites also well-healed   Diagnostic Studies & Laboratory data:     Recent Radiology Findings:   Dg Chest 2 View  Result Date: 10/13/2015 CLINICAL DATA:  Status post CABG on June 26; no current chest complaints. EXAM: CHEST  2 VIEW COMPARISON:  Chest x-ray of September 17, 2015 FINDINGS: There has been further interval decrease in the volume of the pleural effusion on the left. There is no right pleural effusion. There is no pneumothorax. The heart and pulmonary vascularity are normal. There is no interstitial edema.  The mediastinum is normal in width. The sternal wires are intact. The retrosternal soft tissues are normal. IMPRESSION: Further interval improvement in the appearance of the chest with resolution of interstitial edema and a right pleural effusion and interval decrease in the volume of the left pleural effusion. Electronically Signed   By: David  [place] M.D.   On: 10/13/2015 16:28     Recent Lab Findings: Lab Results  Component Value Date   WBC 14.2 (H) 09/17/2015   HGB 9.8 (L) 09/17/2015   HCT 29.3 (L) 09/17/2015   PLT 178 09/17/2015   GLUCOSE 107 (H) 09/17/2015   CHOL 140  09/12/2015   TRIG 128 09/12/2015   HDL 28 (L) 09/12/2015   LDLCALC 86 09/12/2015   ALT 35 09/12/2015   AST 107 (H) 09/12/2015   NA 135 09/17/2015   K 4.3 09/17/2015   CL 101 09/17/2015   CREATININE 1.05 09/17/2015   BUN 8 09/17/2015   CO2 26 09/17/2015   INR 1.44 09/14/2015   HGBA1C 5.7 (H) 09/13/2015      Assessment / Plan:      Patient stable after coronary artery bypass grafting, continues on aspirin and Plavix and statin. Since surgery he has been smoke-free. We discussed returning to limited duty work in August, and proceeding with a full-time work in September, but limiting lifting over 25 pounds for 3 months.  I encouraged the patient to enroll in outpatient cardiac rehabilitation at least for a month, the insurance coverage and co-pays may preclude a full 3 months of therapy.     Delight Ovens MD      [address] [place] [place] [address] Office [phone]   Beeper [phone]  10/13/2015 4:45 PM

## 2015-10-22 ENCOUNTER — Encounter (HOSPITAL_COMMUNITY): Payer: Self-pay | Admitting: *Deleted

## 2015-11-05 ENCOUNTER — Telehealth: Payer: Self-pay | Admitting: Cardiovascular Disease

## 2015-11-05 NOTE — Telephone Encounter (Signed)
[facility].

## 2015-11-05 NOTE — Telephone Encounter (Signed)
[facility] has cardiac rehab consent forms on Dr.Branch's desk for him to complete

## 2015-11-05 NOTE — Telephone Encounter (Signed)
Follow Up:    Faxed an order over for Cardiac Rehab on 09-28-15 and 10-16-15. She still have not received it back. Please fax this asap .

## 2015-11-05 NOTE — Telephone Encounter (Signed)
Signed by Dr. Wyline MoodBranch. Faxed to [phone]

## 2015-11-11 ENCOUNTER — Other Ambulatory Visit: Payer: Self-pay

## 2015-11-11 ENCOUNTER — Ambulatory Visit (INDEPENDENT_AMBULATORY_CARE_PROVIDER_SITE_OTHER): Payer: PRIVATE HEALTH INSURANCE

## 2015-11-11 DIAGNOSIS — I5022 Chronic systolic (congestive) heart failure: Secondary | ICD-10-CM

## 2015-11-16 ENCOUNTER — Other Ambulatory Visit: Payer: Self-pay | Admitting: Physician Assistant

## 2015-11-18 ENCOUNTER — Telehealth: Payer: Self-pay | Admitting: Cardiology

## 2015-11-18 NOTE — Telephone Encounter (Signed)
Patient returning call.

## 2015-11-18 NOTE — Telephone Encounter (Signed)
Echo looks good, heart function has improved and is back to normal  Dominga FerryJ Branch MD   Pt made aware routed to pcp

## 2015-12-18 ENCOUNTER — Other Ambulatory Visit: Payer: Self-pay | Admitting: *Deleted

## 2015-12-18 MED ORDER — METOPROLOL TARTRATE 25 MG PO TABS: 12.5000 mg | ORAL_TABLET | Freq: Two times a day (BID) | ORAL | 3 refills | Status: DC

## 2015-12-18 MED ORDER — ATORVASTATIN CALCIUM 80 MG PO TABS: 80.0000 mg | ORAL_TABLET | Freq: Every day | ORAL | 3 refills | Status: DC

## 2015-12-18 MED ORDER — CLOPIDOGREL BISULFATE 75 MG PO TABS: 75.0000 mg | ORAL_TABLET | Freq: Every day | ORAL | 3 refills | Status: DC

## 2015-12-29 ENCOUNTER — Other Ambulatory Visit: Payer: Self-pay | Admitting: *Deleted

## 2015-12-29 MED ORDER — CLOPIDOGREL BISULFATE 75 MG PO TABS: 75.0000 mg | ORAL_TABLET | Freq: Every day | ORAL | 3 refills | Status: DC

## 2015-12-29 MED ORDER — METOPROLOL TARTRATE 25 MG PO TABS: 12.5000 mg | ORAL_TABLET | Freq: Two times a day (BID) | ORAL | 3 refills | Status: DC

## 2015-12-29 MED ORDER — ATORVASTATIN CALCIUM 80 MG PO TABS: 80.0000 mg | ORAL_TABLET | Freq: Every day | ORAL | 3 refills | Status: DC

## 2016-01-07 ENCOUNTER — Ambulatory Visit (INDEPENDENT_AMBULATORY_CARE_PROVIDER_SITE_OTHER): Payer: PRIVATE HEALTH INSURANCE | Admitting: Cardiology

## 2016-01-07 ENCOUNTER — Encounter: Payer: Self-pay | Admitting: Cardiology

## 2016-01-07 VITALS — BP 114/72 | HR 54 | Ht 66.0 in | Wt 158.8 lb

## 2016-01-07 DIAGNOSIS — R5383 Other fatigue: Secondary | ICD-10-CM

## 2016-01-07 DIAGNOSIS — I2510 Atherosclerotic heart disease of native coronary artery without angina pectoris: Secondary | ICD-10-CM | POA: Diagnosis not present

## 2016-01-07 DIAGNOSIS — I5022 Chronic systolic (congestive) heart failure: Secondary | ICD-10-CM | POA: Diagnosis not present

## 2016-01-07 NOTE — Patient Instructions (Signed)
Your physician wants you to follow-up in: 6 MONTHS WITH DR. BRANCH  You will receive a reminder letter in the mail two months in advance. If you don't receive a letter, please call our office to schedule the follow-up appointment.  Your physician has recommended you make the following change in your medication:   DECREASE ASPIRIN 81 MG DAILY  HOLD LOPRESSOR FOR 2 WEEKS AND CALL US WITH AN UPDATE ON YOUR FATIGUE.  Thank you for choosing [facility]!!

## 2016-01-07 NOTE — Progress Notes (Signed)
Clinical Summary Mr. Phifer is a 55 y.o.male seen today for follow up of the following medical problems.   1. CAD/ICM/Chronic systolic HF - admitted 08/2015 with chest pain, NSTEMI, peak trop 25.  - cath showed occluded LAD and RCA - 09/14/15 CABG with LIMA-LAD, SVG-RCA. - LVEF by LV gram 35-45%. Repeat echo 10/2015 shows LVEF 50-55% - soft bp's limited medical therapy   - no recent chest pain. No SOB or DOE - has had some fatigue.      SH: works Nutritional therapist Past Medical History:  Diagnosis Date  . Tobacco user      Allergies  Allergen Reactions  . Amoxicillin Other (See Comments)    Patient just stated that he got very sick .   Marland Kitchen Penicillins Other (See Comments)    Patient just stated that he got very sick     Current Outpatient Prescriptions  Medication Sig Dispense Refill  . aspirin EC 81 MG EC tablet Take 4 tablets (325 mg total) by mouth daily.    Marland Kitchen atorvastatin (LIPITOR) 80 MG tablet Take 1 tablet (80 mg total) by mouth daily at 6 PM. 90 tablet 3  . clopidogrel (PLAVIX) 75 MG tablet Take 1 tablet (75 mg total) by mouth daily. 90 tablet 3  . metoprolol tartrate (LOPRESSOR) 25 MG tablet Take 0.5 tablets (12.5 mg total) by mouth 2 (two) times daily. 90 tablet 3  . oxyCODONE (OXY IR/ROXICODONE) 5 MG immediate release tablet Take 1-2 tablets (5-10 mg total) by mouth every 4 (four) hours as needed for severe pain. 40 tablet 0   No current facility-administered medications for this visit.      Past Surgical History:  Procedure Laterality Date  . CARDIAC CATHETERIZATION N/A 09/11/2015   Procedure: Left Heart Cath and Coronary Angiography;  Surgeon: Lyn Records, MD;  Location: [facility] INVASIVE CV LAB;  Service: Cardiovascular;  Laterality: N/A;  . CORONARY ARTERY BYPASS GRAFT N/A 09/14/2015   Procedure: CORONARY ARTERY BYPASS GRAFTING (CABG) times two using left internal mammary to left anterior descending coronary artery and right greater saphenous to distal right  coronary artery.  Biopsy of peri- aortic lymoh node.;  Surgeon: Delight Ovens, MD;  Location: [facility] OR;  Service: Open Heart Surgery;  Laterality: N/A;  . TEE WITHOUT CARDIOVERSION N/A 09/14/2015   Procedure: TRANSESOPHAGEAL ECHOCARDIOGRAM (TEE);  Surgeon: Delight Ovens, MD;  Location: [facility] OR;  Service: Open Heart Surgery;  Laterality: N/A;     Allergies  Allergen Reactions  . Amoxicillin Other (See Comments)    Patient just stated that he got very sick .   Marland Kitchen Penicillins Other (See Comments)    Patient just stated that he got very sick      Family History  Problem Relation Age of Onset  . Heart attack Brother     a. CABG in his 1's     Social History Mr. Lama reports that he quit smoking about 3 months ago. His smoking use included Cigarettes. He started smoking about 40 years ago. He has a 45.00 pack-year smoking history. He has never used smokeless tobacco. Mr. Mcfarlan has no alcohol history on file.   Review of Systems CONSTITUTIONAL: +fatigue HEENT: Eyes: No visual loss, blurred vision, double vision or yellow sclerae.No hearing loss, sneezing, congestion, runny nose or sore throat.  SKIN: No rash or itching.  CARDIOVASCULAR: per hpi RESPIRATORY: No shortness of breath, cough or sputum.  GASTROINTESTINAL: No anorexia, nausea, vomiting or diarrhea. No abdominal pain or  blood.  GENITOURINARY: No burning on urination, no polyuria NEUROLOGICAL: No headache, dizziness, syncope, paralysis, ataxia, numbness or tingling in the extremities. No change in bowel or bladder control.  MUSCULOSKELETAL: No muscle, back pain, joint pain or stiffness.  LYMPHATICS: No enlarged nodes. No history of splenectomy.  PSYCHIATRIC: No history of depression or anxiety.  ENDOCRINOLOGIC: No reports of sweating, cold or heat intolerance. No polyuria or polydipsia.  Marland Kitchen   Physical Examination Vitals:   01/07/16 1502  BP: 114/72  Pulse: (!) 54   Vitals:   01/07/16 1502  Weight: 158 lb  12.8 oz (72 kg)  Height:  (1.676 m)    Gen: resting comfortably, no acute distress HEENT: no scleral icterus, pupils equal round and reactive, no palptable cervical adenopathy,  CV: RRR, no m/r/g, no jvd Resp: Clear to auscultation bilaterally GI: abdomen is soft, non-tender, non-distended, normal bowel sounds, no hepatosplenomegaly MSK: extremities are warm, no edema.  Skin: warm, no rash Neuro:  no focal deficits Psych: appropriate affect   Diagnostic Studies /2017 Cath 1. Ost RCA to Prox RCA lesion, 100% stenosed. 2. Prox LAD lesion, 100% stenosed. 3. Dist RCA lesion, 100% stenosed.   Total occlusion of the proximal RCA. RCA is dominant giving 3 large inferior wall vessels taken up the territory of the distal circumflex. Left right collaterals are noted.  Total occlusion of the proximal to mid LAD beyond the origin of the first elbow perforator. Left left collaterals are noted.  Circumflex is relatively small and widely patent..   The large branching ramus intermedius is widely patent and supplies many of the collaterals to both RCA and LAD.  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 LVEDP is mildly elevated.  Post cath RECOMMENDATIONS:   Resume IV heparin  Low-dose beta blocker therapy as tolerated  TCTS consult, Dr. Tyrone Sage has been notified.  10/2015 echo Study Conclusions  - Left ventricle: The cavity size was normal. Wall thickness was   normal. Systolic function was normal. The estimated ejection   fraction was in the range of 50% to 55%. Left ventricular   diastolic function parameters were normal. - Aortic valve: Valve area (VTI): 2.11 cm^2. Valve area (Vmax):   2.17 cm^2. Valve area (Vmean): 2.03 cm^2. - Mitral valve: There was mild regurgitation. - Atrial septum: No defect or patent foramen ovale was identified. - Technically adequate study.  Assessment and Plan   1. CAD/ICM/Chronic systolic HF -  s/p recent CABG. No current symptoms. - medical therapy limited by soft bp's, has not been on ACE-I. W - DAPT until 08/2016 in setting of ACS and CABG - repeat echo shows normalized LVEF - generalized fatigue, he will hold lopressor x 2 weeks and see if symptoms improve. If not consider OSA evaluation in near future  F/u 6 months     Antoine Poche, M.D.

## 2016-02-13 ENCOUNTER — Other Ambulatory Visit: Payer: Self-pay | Admitting: Physician Assistant

## 2016-05-25 ENCOUNTER — Encounter: Payer: Self-pay | Admitting: Cardiology

## 2016-05-25 ENCOUNTER — Ambulatory Visit (INDEPENDENT_AMBULATORY_CARE_PROVIDER_SITE_OTHER): Payer: PRIVATE HEALTH INSURANCE | Admitting: Cardiology

## 2016-05-25 VITALS — BP 124/80 | HR 69 | Ht 66.5 in | Wt 164.0 lb

## 2016-05-25 DIAGNOSIS — I2510 Atherosclerotic heart disease of native coronary artery without angina pectoris: Secondary | ICD-10-CM

## 2016-05-25 DIAGNOSIS — R5383 Other fatigue: Secondary | ICD-10-CM | POA: Diagnosis not present

## 2016-05-25 DIAGNOSIS — I5022 Chronic systolic (congestive) heart failure: Secondary | ICD-10-CM

## 2016-05-25 MED ORDER — ATORVASTATIN CALCIUM 40 MG PO TABS: 40.0000 mg | ORAL_TABLET | Freq: Every day | ORAL | 3 refills | Status: DC

## 2016-05-25 MED ORDER — CLOPIDOGREL BISULFATE 75 MG PO TABS: 75.0000 mg | ORAL_TABLET | Freq: Every day | ORAL | 1 refills | Status: DC

## 2016-05-25 NOTE — Patient Instructions (Signed)
Medication Instructions:  ON September 10, 2016 STOP PLAVIX DECREASE LIPITOR TO 40 MG DAILY   Labwork: NONE  Testing/Procedures: NONE  Follow-Up: Your physician wants you to follow-up in: 6 MONTHS .  You will receive a reminder letter in the mail two months in advance. If you don't receive a letter, please call our office to schedule the follow-up appointment.   Any Other Special Instructions Will Be Listed Below (If Applicable).     If you need a refill on your cardiac medications before your next appointment, please call your pharmacy.

## 2016-05-25 NOTE — Progress Notes (Signed)
Clinical Summary [name] is a 56 y.o.male  seen today for follow up of the following medical problems.   1. CAD/ICM/Chronic systolic HF - admitted 08/2015 with chest pain, NSTEMI, peak trop 25.  - cath showed occluded LAD and RCA - 09/14/15 CABG with LIMA-LAD, SVG-RCA. - LVEF by LV gram 35-45%. Repeat echo 10/2015 shows LVEF 50-55% - soft bp's limited medical therapy   - denies any chest pain. No SOB or DOE - compliant with meds. Off lopressor due to fatigue. He reports statin causes diarrhea.   2. Fatigue - +snoring. No apneic episodes. +Daytime somnolence.  - wants to hold off on sleep study   Past Medical History:  Diagnosis Date  . Tobacco user      Allergies  Allergen Reactions  . Amoxicillin Other (See Comments)    Patient just stated that he got very sick .   Marland Kitchen. Penicillins Other (See Comments)    Patient just stated that he got very sick     Current Outpatient Prescriptions  Medication Sig Dispense Refill  . aspirin EC 81 MG tablet Take 81 mg by mouth daily.    Marland Kitchen. atorvastatin (LIPITOR) 80 MG tablet Take 1 tablet (80 mg total) by mouth daily at 6 PM. 90 tablet 3  . clopidogrel (PLAVIX) 75 MG tablet Take 1 tablet (75 mg total) by mouth daily. 90 tablet 3  . metoprolol tartrate (LOPRESSOR) 25 MG tablet Take 0.5 tablets (12.5 mg total) by mouth 2 (two) times daily. 90 tablet 3   No current facility-administered medications for this visit.      Past Surgical History:  Procedure Laterality Date  . CARDIAC CATHETERIZATION N/A 09/11/2015   Procedure: Left Heart Cath and Coronary Angiography;  Surgeon: Lyn RecordsHenry W Smith, MD;  [facility];  Service: Cardiovascular;  Laterality: N/A;  . CORONARY ARTERY BYPASS GRAFT N/A 09/14/2015   Procedure: CORONARY ARTERY BYPASS GRAFTING (CABG) times two using left internal mammary to left anterior descending coronary artery and right greater saphenous to distal right coronary artery.  Biopsy of peri- aortic  lymoh node.;  Surgeon: Delight OvensEdward B Gerhardt, MD;  [facility];  Service: Open Heart Surgery;  Laterality: N/A;  . TEE WITHOUT CARDIOVERSION N/A 09/14/2015   Procedure: TRANSESOPHAGEAL ECHOCARDIOGRAM (TEE);  Surgeon: Delight OvensEdward B Gerhardt, MD;  [facility];  Service: Open Heart Surgery;  Laterality: N/A;     Allergies  Allergen Reactions  . Amoxicillin Other (See Comments)    Patient just stated that he got very sick .   Marland Kitchen. Penicillins Other (See Comments)    Patient just stated that he got very sick      Family History  Problem Relation Age of Onset  . Heart attack Brother     a. CABG in his 6850's     Social History [name] reports that he quit smoking about 8 months ago. His smoking use included Cigarettes. He started smoking about 41 years ago. He has a 45.00 pack-year smoking history. He has never used smokeless tobacco. [name] has no alcohol history on file.   Review of Systems CONSTITUTIONAL: No weight loss, fever, chills, weakness or fatigue.  HEENT: Eyes: No visual loss, blurred vision, double vision or yellow sclerae.No hearing loss, sneezing, congestion, runny nose or sore throat.  SKIN: No rash or itching.  CARDIOVASCULAR: per hpi RESPIRATORY: No shortness of breath, cough or sputum.  GASTROINTESTINAL: No anorexia, nausea, vomiting or diarrhea. No abdominal pain or blood.  GENITOURINARY: No burning on urination, no polyuria NEUROLOGICAL: No headache, dizziness, syncope, paralysis, ataxia, numbness or tingling in the extremities. No change in bowel or bladder control.  MUSCULOSKELETAL: No muscle, back pain, joint pain or stiffness.  LYMPHATICS: No enlarged nodes. No history of splenectomy.  PSYCHIATRIC: No history of depression or anxiety.  ENDOCRINOLOGIC: No reports of sweating, cold or heat intolerance. No polyuria or polydipsia.  Marland Kitchen   Physical Examination Vitals:   05/25/16 1407  BP: 124/80  Pulse: 69   Vitals:   05/25/16 1407  Weight: 164 lb  (74.4 kg)  Height: 5' 6.5" (1.689 m)     Gen: resting comfortably, no acute distress HEENT: no scleral icterus, pupils equal round and reactive, no palptable cervical adenopathy,  CV: RRR, no m/r/g, no jvd Resp: Clear to auscultation bilaterally GI: abdomen is soft, non-tender, non-distended, normal bowel sounds, no hepatosplenomegaly MSK: extremities are warm, no edema.  Skin: warm, no rash Neuro:  no focal deficits Psych: appropriate affect   Diagnostic Studies /2017 Cath 1. Ost RCA to Prox RCA lesion, 100% stenosed. 2. Prox LAD lesion, 100% stenosed. 3. Dist RCA lesion, 100% stenosed.   Total occlusion of the proximal RCA. RCA is dominant giving 3 large inferior wall vessels taken up the territory of the distal circumflex. Left right collaterals are noted.  Total occlusion of the proximal to mid LAD beyond the origin of the first elbow perforator. Left left collaterals are noted.  Circumflex is relatively small and widely patent..   The large branching ramus intermedius is widely patent and supplies many of the collaterals to both RCA and LAD.  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 LVEDP is mildly elevated.  Post cath RECOMMENDATIONS:   Resume IV heparin  Low-dose beta blocker therapy as tolerated  TCTS consult, Dr. Tyrone Sage has been notified.  10/2015 echo Study Conclusions  - Left ventricle: The cavity size was normal. Wall thickness was normal. Systolic function was normal. The estimated ejection fraction was in the range of 50% to 55%. Left ventricular diastolic function parameters were normal. - Aortic valve: Valve area (VTI): 2.11 cm^2. Valve area (Vmax): 2.17 cm^2. Valve area (Vmean): 2.03 cm^2. - Mitral valve: There was mild regurgitation. - Atrial septum: No defect or patent foramen ovale was identified. - Technically adequate study.    Assessment and Plan   1. CAD/ICM/Chronic systolic  HF - DAPT until 08/2016 in setting of ACS and CABG - repeat echo shows normalized LVEF - medical therapy limited due to side effects. Off lopressor due to fatigue. Will try low dose ACE-I. Lower statin to 40mg  to see if GI side effects improve  2. Fatigue - improved but not resolved off beta blocker - we discussed possible sleep study. He would like to hold off for now.     F/u 6 months     Antoine Poche, M.D.

## 2016-07-15 ENCOUNTER — Ambulatory Visit: Payer: PRIVATE HEALTH INSURANCE | Admitting: Cardiology

## 2016-11-18 ENCOUNTER — Encounter: Payer: Self-pay | Admitting: Cardiology

## 2016-11-18 ENCOUNTER — Ambulatory Visit (INDEPENDENT_AMBULATORY_CARE_PROVIDER_SITE_OTHER): Payer: BLUE CROSS/BLUE SHIELD | Admitting: Cardiology

## 2016-11-18 VITALS — BP 96/58 | HR 57 | Ht 66.0 in | Wt 175.0 lb

## 2016-11-18 DIAGNOSIS — I5022 Chronic systolic (congestive) heart failure: Secondary | ICD-10-CM

## 2016-11-18 DIAGNOSIS — R5383 Other fatigue: Secondary | ICD-10-CM

## 2016-11-18 DIAGNOSIS — I2510 Atherosclerotic heart disease of native coronary artery without angina pectoris: Secondary | ICD-10-CM

## 2016-11-18 NOTE — Progress Notes (Signed)
Clinical Summary Adam Vega is a 56 y.o.male seen today for follow up of the following medical problems.   1. CAD/ICM/Chronic systolic HF - admitted 08/2015 with chest pain, NSTEMI, peak trop 25.  - cath showed occluded LAD and RCA - 09/14/15 CABG with LIMA-LAD, SVG-RCA. - LVEF by LV gram 35-45%.Repeat echo 10/2015 shows LVEF 50-55% - soft bp's limited medical therapy    - last visit lowered atorvastatin to 40mg  daily due to GI side effects. Lopressor stopped due to fatigue, tried low dose ACE-I.  - no recent chest pain. Can have some SOB at times. Particularly walking up hill, fairly stable.  - no recent edema. - compliant with meds. Tolerating 40 mg daily.   2. Fatigue - +snoring. No apneic episodes. +Daytime somnolence.  - wants to hold off on sleep study   Past Medical History:  Diagnosis Date  . Tobacco user      Allergies  Allergen Reactions  . Amoxicillin Other (See Comments)    Patient just stated that he got very sick .   Marland Kitchen Penicillins Other (See Comments)    Patient just stated that he got very sick     Current Outpatient Prescriptions  Medication Sig Dispense Refill  . aspirin EC 81 MG tablet Take 81 mg by mouth daily.    Marland Kitchen atorvastatin (LIPITOR) 40 MG tablet Take 1 tablet (40 mg total) by mouth daily. 90 tablet 3  . clopidogrel (PLAVIX) 75 MG tablet Take 1 tablet (75 mg total) by mouth daily. 90 tablet 1   No current facility-administered medications for this visit.      Past Surgical History:  Procedure Laterality Date  . CARDIAC CATHETERIZATION N/A 09/11/2015   Procedure: Left Heart Cath and Coronary Angiography;  Surgeon: Lyn Records, MD;  Location: [facility] INVASIVE CV LAB;  Service: Cardiovascular;  Laterality: N/A;  . CORONARY ARTERY BYPASS GRAFT N/A 09/14/2015   Procedure: CORONARY ARTERY BYPASS GRAFTING (CABG) times two using left internal mammary to left anterior descending coronary artery and right greater saphenous to distal right  coronary artery.  Biopsy of peri- aortic lymoh node.;  Surgeon: Delight Ovens, MD;  Location: [facility] OR;  Service: Open Heart Surgery;  Laterality: N/A;  . TEE WITHOUT CARDIOVERSION N/A 09/14/2015   Procedure: TRANSESOPHAGEAL ECHOCARDIOGRAM (TEE);  Surgeon: Delight Ovens, MD;  Location[facility] OR;  Service: Open Heart Surgery;  Laterality: N/A;     Allergies  Allergen Reactions  . Amoxicillin Other (See Comments)    Patient just stated that he got very sick .   Marland Kitchen Penicillins Other (See Comments)    Patient just stated that he got very sick      Family History  Problem Relation Age of Onset  . Heart attack Brother        a. CABG in his 63's     Social History Mr. Marsalis reports that he quit smoking about 14 months ago. His smoking use included Cigarettes. He started smoking about 41 years ago. He has a 45.00 pack-year smoking history. He has never used smokeless tobacco. Mr. Bagheri has no alcohol history on file.   Review of Systems CONSTITUTIONAL: +fatigue  HEENT: Eyes: No visual loss, blurred vision, double vision or yellow sclerae.No hearing loss, sneezing, congestion, runny nose or sore throat.  SKIN: No rash or itching.  CARDIOVASCULAR: per hpi RESPIRATORY: No shortness of breath, cough or sputum.  GASTROINTESTINAL: No anorexia, nausea, vomiting or diarrhea. No abdominal pain or blood.  GENITOURINARY:  No burning on urination, no polyuria NEUROLOGICAL: No headache, dizziness, syncope, paralysis, ataxia, numbness or tingling in the extremities. No change in bowel or bladder control.  MUSCULOSKELETAL: No muscle, back pain, joint pain or stiffness.  LYMPHATICS: No enlarged nodes. No history of splenectomy.  PSYCHIATRIC: No history of depression or anxiety.  ENDOCRINOLOGIC: No reports of sweating, cold or heat intolerance. No polyuria or polydipsia.  Marland Kitchen.   Physical Examination Vitals:   11/18/16 1536  BP: (!) 96/58  Pulse: (!) 57  SpO2: 97%   Vitals:   11/18/16  1536  Weight: 175 lb (79.4 kg)  Height: 5\' 6"  (1.676 m)     Gen: resting comfortably, no acute distress HEENT: no scleral icterus, pupils equal round and reactive, no palptable cervical adenopathy,  CV: RRR, no m/rg, no jvd Resp: Clear to auscultation bilaterally GI: abdomen is soft, non-tender, non-distended, normal bowel sounds, no hepatosplenomegaly MSK: extremities are warm, no edema.  Skin: warm, no rash Neuro:  no focal deficits Psych: appropriate affect   Diagnostic Studies /2017 Cath 1. Ost RCA to Prox RCA lesion, 100% stenosed. 2. Prox LAD lesion, 100% stenosed. 3. Dist RCA lesion, 100% stenosed.   Total occlusion of the proximal RCA. RCA is dominant giving 3 large inferior wall vessels taken up the territory of the distal circumflex. Left right collaterals are noted.  Total occlusion of the proximal to mid LAD beyond the origin of the first elbow perforator. Left left collaterals are noted.  Circumflex is relatively small and widely patent..   The large branching ramus intermedius is widely patent and supplies many of the collaterals to both RCA and LAD.  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 LVEDP is mildly elevated.  Post cath RECOMMENDATIONS:   Resume IV heparin  Low-dose beta blocker therapy as tolerated  TCTS consult, Dr. Tyrone SageGerhardt has been notified.  10/2015 echo Study Conclusions  - Left ventricle: The cavity size was normal. Wall thickness was normal. Systolic function was normal. The estimated ejection fraction was in the range of 50% to 55%. Left ventricular diastolic function parameters were normal. - Aortic valve: Valve area (VTI): 2.11 cm^2. Valve area (Vmax): 2.17 cm^2. Valve area (Vmean): 2.03 cm^2. - Mitral valve: There was mild regurgitation. - Atrial septum: No defect or patent foramen ovale was identified. - Technically adequate study.    Assessment and Plan  1.  CAD/ICM/Chronic systolic HF - repeat echo shows normalized LVEF - medical therapy limited due to side effects. Off lopressor due to fatigue.  Lower statin to 40mg  to see if GI side effects  2. Fatigue - improved but not resolved off beta blocker - we discussed possible sleep study. He would like to hold off for now.     F/u 6 months      Antoine PocheJonathan F. Amori Colomb, M.D.

## 2016-11-18 NOTE — Patient Instructions (Signed)

## 2016-12-02 ENCOUNTER — Ambulatory Visit: Payer: PRIVATE HEALTH INSURANCE | Admitting: Cardiology

## 2017-06-19 ENCOUNTER — Other Ambulatory Visit: Payer: Self-pay | Admitting: Cardiology

## 2017-10-13 ENCOUNTER — Other Ambulatory Visit: Payer: Self-pay | Admitting: Cardiology

## 2017-10-31 ENCOUNTER — Other Ambulatory Visit: Payer: Self-pay

## 2017-10-31 MED ORDER — ATORVASTATIN CALCIUM 40 MG PO TABS: 40.0000 mg | ORAL_TABLET | Freq: Every day | ORAL | 1 refills | Status: DC

## 2017-12-15 ENCOUNTER — Encounter: Payer: Self-pay | Admitting: Cardiology

## 2017-12-15 ENCOUNTER — Ambulatory Visit (INDEPENDENT_AMBULATORY_CARE_PROVIDER_SITE_OTHER): Payer: Self-pay | Admitting: Cardiology

## 2017-12-15 VITALS — BP 125/74 | HR 54 | Ht 66.0 in | Wt 175.4 lb

## 2017-12-15 DIAGNOSIS — I2510 Atherosclerotic heart disease of native coronary artery without angina pectoris: Secondary | ICD-10-CM

## 2017-12-15 DIAGNOSIS — I5022 Chronic systolic (congestive) heart failure: Secondary | ICD-10-CM

## 2017-12-15 NOTE — Patient Instructions (Signed)

## 2017-12-15 NOTE — Progress Notes (Signed)
Clinical Summary Adam Vega is a 57 y.o.male seen today for follow up of the following medical problems.   1. CAD/ICM/Chronic systolic HF - admitted 08/2015 with chest pain, NSTEMI, peak trop 25.  - cath showed occluded LAD and RCA - 09/14/15 CABG with LIMA-LAD, SVG-RCA. - LVEF by LV gram 35-45%.Repeat echo 10/2015 shows LVEF 50-55% - soft bp's limited medical therapy   - lowered atorvastatin to 40mg  daily due to GI side effects. Lopressor stopped due to fatigue, tried low dose ACE-I but stopped due to low bp's.     - compliant with meds.  - no recent chest pain. No SOB or DOE - rides bike every other day, about 45 minutes tolearting without troubles.      Past Medical History:  Diagnosis Date  . Tobacco user      Allergies  Allergen Reactions  . Amoxicillin Other (See Comments)    Patient just stated that he got very sick .   Marland Kitchen Penicillins Other (See Comments)    Patient just stated that he got very sick     Current Outpatient Medications  Medication Sig Dispense Refill  . aspirin EC 81 MG tablet Take 81 mg by mouth daily.    Marland Kitchen atorvastatin (LIPITOR) 40 MG tablet Take 1 tablet (40 mg total) by mouth daily. 30 tablet 1   No current facility-administered medications for this visit.      Past Surgical History:  Procedure Laterality Date  . CARDIAC CATHETERIZATION N/A 09/11/2015   Procedure: Left Heart Cath and Coronary Angiography;  Surgeon: Adam Records, Adam Vega;  Location: [facility] INVASIVE CV LAB;  Service: Cardiovascular;  Laterality: N/A;  . CORONARY ARTERY BYPASS GRAFT N/A 09/14/2015   Procedure: CORONARY ARTERY BYPASS GRAFTING (CABG) times two using left internal mammary to left anterior descending coronary artery and right greater saphenous to distal right coronary artery.  Biopsy of peri- aortic lymoh node.;  Surgeon: Adam Ovens, Adam Vega;  Location: [facility] OR;  Service: Open Heart Surgery;  Laterality: N/A;  . TEE WITHOUT CARDIOVERSION N/A 09/14/2015   Procedure: TRANSESOPHAGEAL ECHOCARDIOGRAM (TEE);  Surgeon: Adam Ovens, Adam Vega;  Location: [facility] OR;  Service: Open Heart Surgery;  Laterality: N/A;     Allergies  Allergen Reactions  . Amoxicillin Other (See Comments)    Patient just stated that he got very sick .   Marland Kitchen Penicillins Other (See Comments)    Patient just stated that he got very sick      Family History  Problem Relation Age of Onset  . Heart attack Brother        a. CABG in his 47's     Social History Mr. Minder reports that he quit smoking about 2 years ago. His smoking use included cigarettes. He started smoking about 42 years ago. He has a 45.00 pack-year smoking history. He has never used smokeless tobacco. Mr. Grenz has no alcohol history on file.   Review of Systems CONSTITUTIONAL: No weight loss, fever, chills, weakness or fatigue.  HEENT: Eyes: No visual loss, blurred vision, double vision or yellow sclerae.No hearing loss, sneezing, congestion, runny nose or sore throat.  SKIN: No rash or itching.  CARDIOVASCULAR: per hpi RESPIRATORY: No shortness of breath, cough or sputum.  GASTROINTESTINAL: No anorexia, nausea, vomiting or diarrhea. No abdominal pain or blood.  GENITOURINARY: No burning on urination, no polyuria NEUROLOGICAL: No headache, dizziness, syncope, paralysis, ataxia, numbness or tingling in the extremities. No change in bowel or bladder control.  MUSCULOSKELETAL:  No muscle, back pain, joint pain or stiffness.  LYMPHATICS: No enlarged nodes. No history of splenectomy.  PSYCHIATRIC: No history of depression or anxiety.  ENDOCRINOLOGIC: No reports of sweating, cold or heat intolerance. No polyuria or polydipsia.  Marland Kitchen   Physical Examination Vitals:   12/15/17 1522  BP: 125/74  Pulse: (!) 54   Vitals:   12/15/17 1522  Weight: 175 lb 6.4 oz (79.6 kg)  Height: 5\' 6"  (1.676 m)    Gen: resting comfortably, no acute distress HEENT: no scleral icterus, pupils equal round and reactive,  no palptable cervical adenopathy,  CV: RRR, no m/r/g, no jvd Resp: Clear to auscultation bilaterally GI: abdomen is soft, non-tender, non-distended, normal bowel sounds, no hepatosplenomegaly MSK: extremities are warm, no edema.  Skin: warm, no rash Neuro:  no focal deficits Psych: appropriate affect   Diagnostic Studies 2017 Cath 1. Ost RCA to Prox RCA lesion, 100% stenosed. 2. Prox LAD lesion, 100% stenosed. 3. Dist RCA lesion, 100% stenosed.   Total occlusion of the proximal RCA. RCA is dominant giving 3 large inferior wall vessels taken up the territory of the distal circumflex. Left right collaterals are noted.  Total occlusion of the proximal to mid LAD beyond the origin of the first elbow perforator. Left left collaterals are noted.  Circumflex is relatively small and widely patent..   The large branching ramus intermedius is widely patent and supplies many of the collaterals to both RCA and LAD.  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 LVEDP is mildly elevated.  Post cath RECOMMENDATIONS:   Resume IV heparin  Low-dose beta blocker therapy as tolerated  TCTS consult, Dr. Tyrone Sage has been notified.  10/2015 echo Study Conclusions  - Left ventricle: The cavity size was normal. Wall thickness was normal. Systolic function was normal. The estimated ejection fraction was in the range of 50% to 55%. Left ventricular diastolic function parameters were normal. - Aortic valve: Valve area (VTI): 2.11 cm^2. Valve area (Vmax): 2.17 cm^2. Valve area (Vmean): 2.03 cm^2. - Mitral valve: There was mild regurgitation. - Atrial septum: No defect or patent foramen ovale was identified. - Technically adequate study.     Assessment and Plan  1. CAD/ICM/Chronic systolic HF - medical therapy limited due to side effects. - no recent symptoms, continue current meds - EKG today sinus rhythm, no acute ischemic  changes   F/u 1 year      Adam Vega, M.D.

## 2017-12-16 ENCOUNTER — Encounter: Payer: Self-pay | Admitting: Cardiology

## 2017-12-29 ENCOUNTER — Other Ambulatory Visit: Payer: Self-pay | Admitting: Cardiology

## 2018-01-29 ENCOUNTER — Telehealth: Payer: Self-pay | Admitting: Cardiology

## 2018-01-29 DIAGNOSIS — I5022 Chronic systolic (congestive) heart failure: Secondary | ICD-10-CM

## 2018-01-29 NOTE — Telephone Encounter (Signed)
Yes that is fine, please order a CMET, Mg, CBC, TSH, lipid panel, HgbA1c.   Dominga Ferry MD

## 2018-01-29 NOTE — Telephone Encounter (Signed)
Pt will have labs done at [facility] next Friday or around that time

## 2018-01-29 NOTE — Telephone Encounter (Signed)
Wife needs to know if Dr Wyline Mood would order labs for them to have done at [facility] facility due to having [facility] assistance

## 2018-03-23 ENCOUNTER — Telehealth: Payer: Self-pay | Admitting: Cardiology

## 2018-03-23 NOTE — Telephone Encounter (Signed)
Tara (wife) Delice Bisoncalled in regards to her husband's bill. She would like to speak with someone . Please call [phone].

## 2018-05-11 IMAGING — DX DG CHEST 2V
2 series · 2 of 2 positions shown · non-contrast
Comparison: Chest x-ray dated 09/11/2015

CLINICAL DATA: Shortness of breath, syncope a few days ago.
Scheduled for CABG [place]. Smoker for 20 years.

EXAM:
CHEST  2 VIEW

[w chest pa]
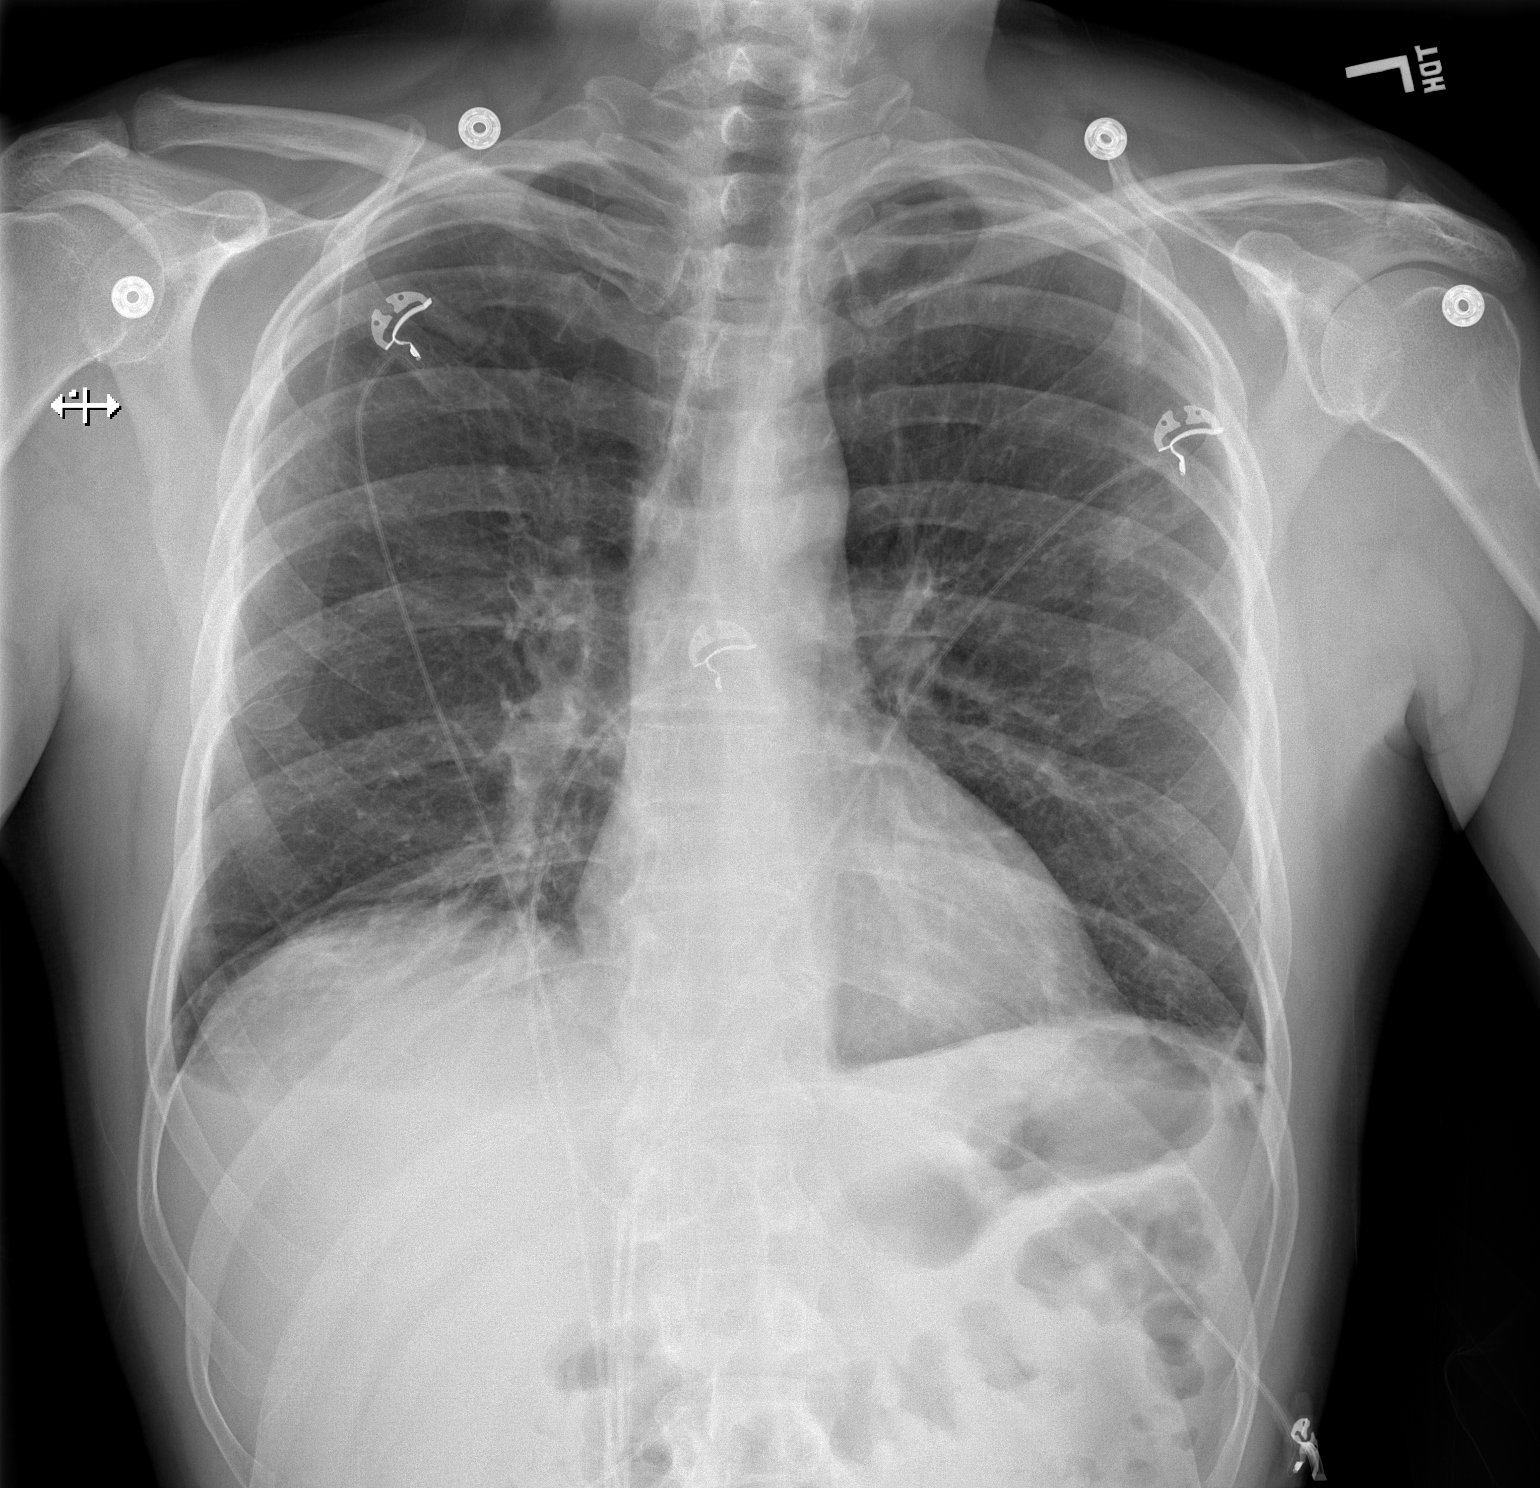

[w chest lat]
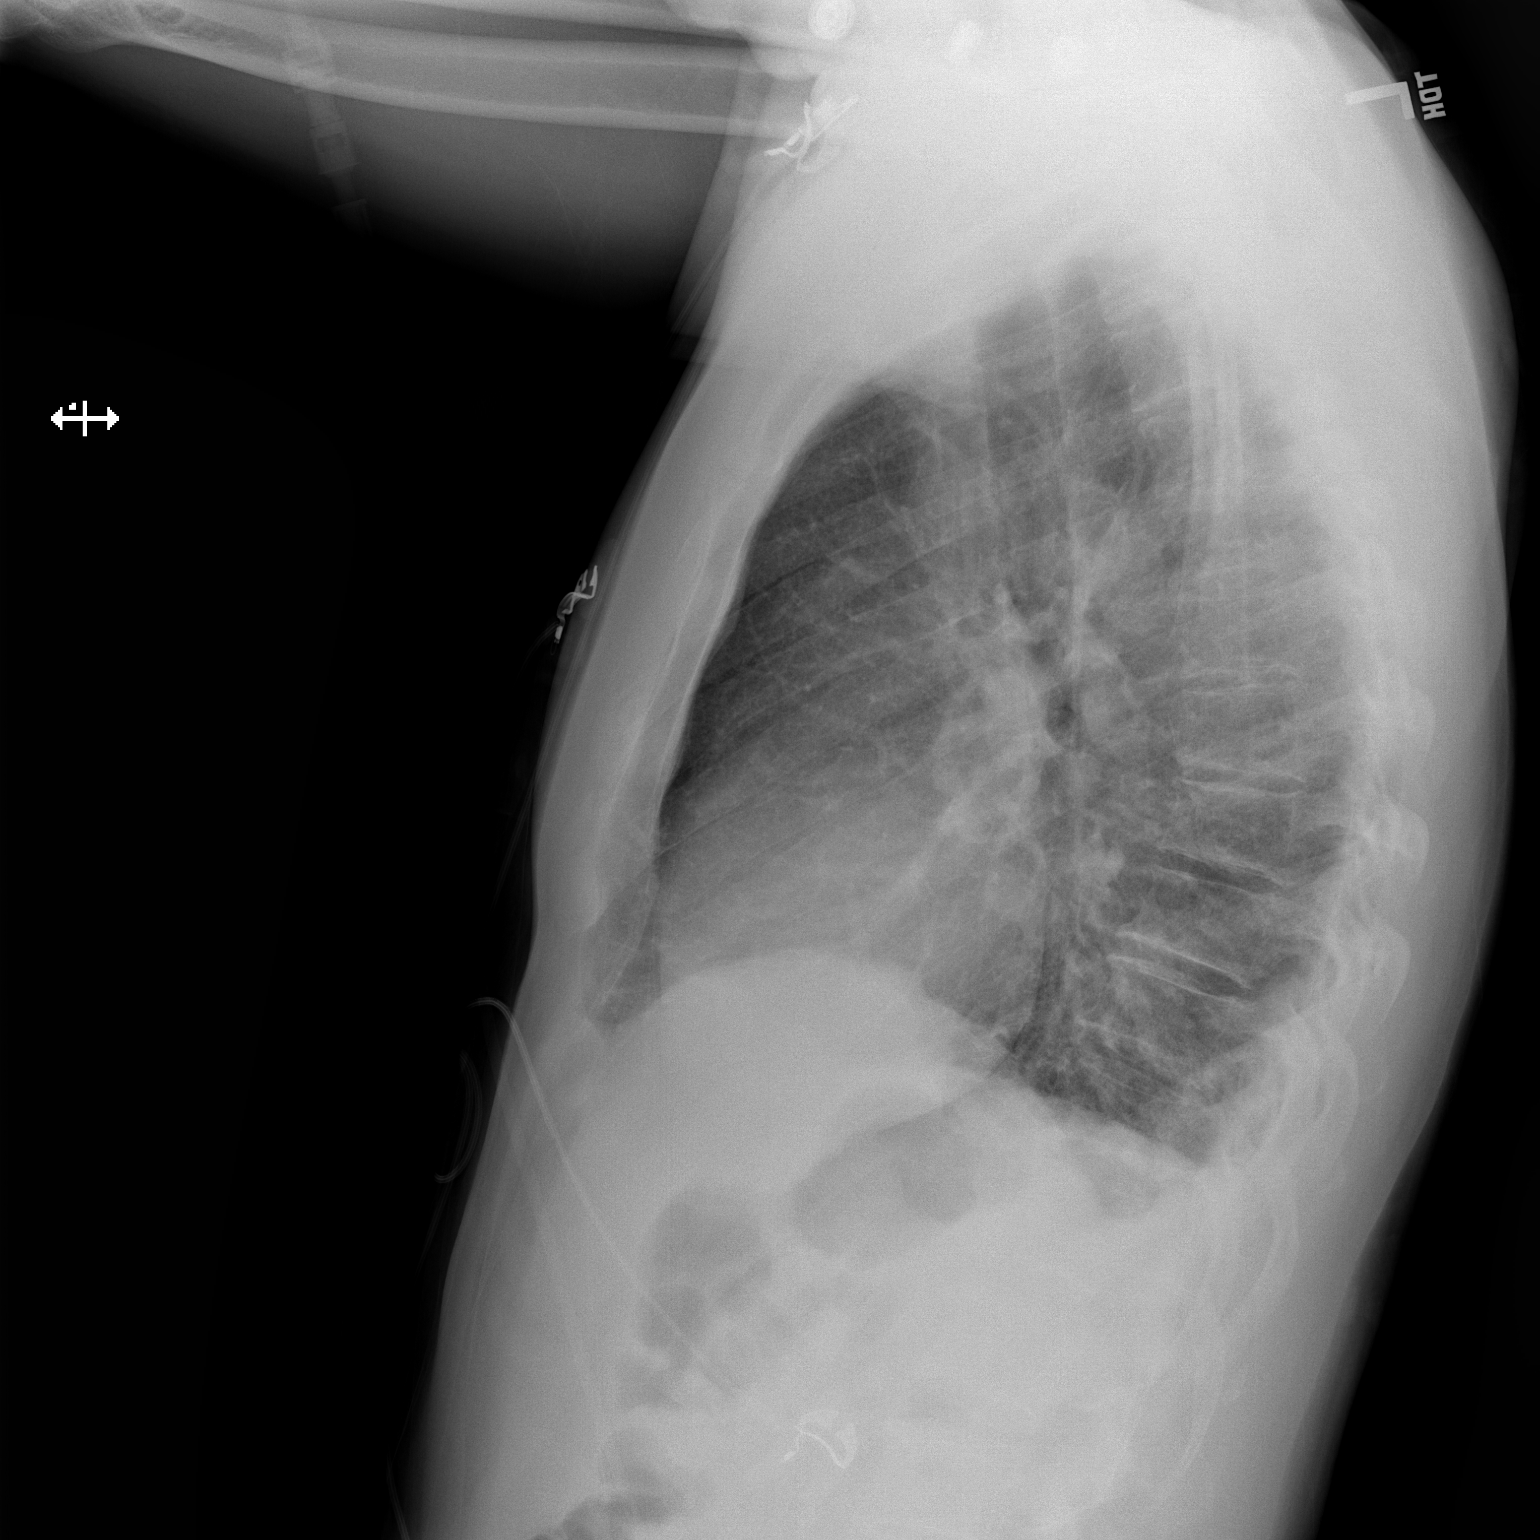

[2 of 2 positions shown; findings below may reference images not displayed]

FINDINGS: Heart size is normal. Overall cardiomediastinal silhouette is normal
in size and configuration.

Probable mild scarring/fibrosis at each lung base. Suspect chronic
bronchitic changes centrally. Lungs otherwise clear. No evidence of
pneumonia. No pleural effusion or pneumothorax seen.

Mild degenerative spurring noted within the thoracic spine. No acute
appearing osseous abnormality.
IMPRESSION: No active cardiopulmonary disease. Probable mild scarring/fibrosis
at each lung base. Suspect chronic bronchitic changes centrally.

## 2018-06-10 IMAGING — CR DG CHEST 2V
2 series · 2 of 2 positions shown · non-contrast
Comparison: Chest x-ray of September 17, 2015

CLINICAL DATA: Status post CABG on [DATE]; no current chest
complaints.

EXAM:
CHEST  2 VIEW

[w chest pa]
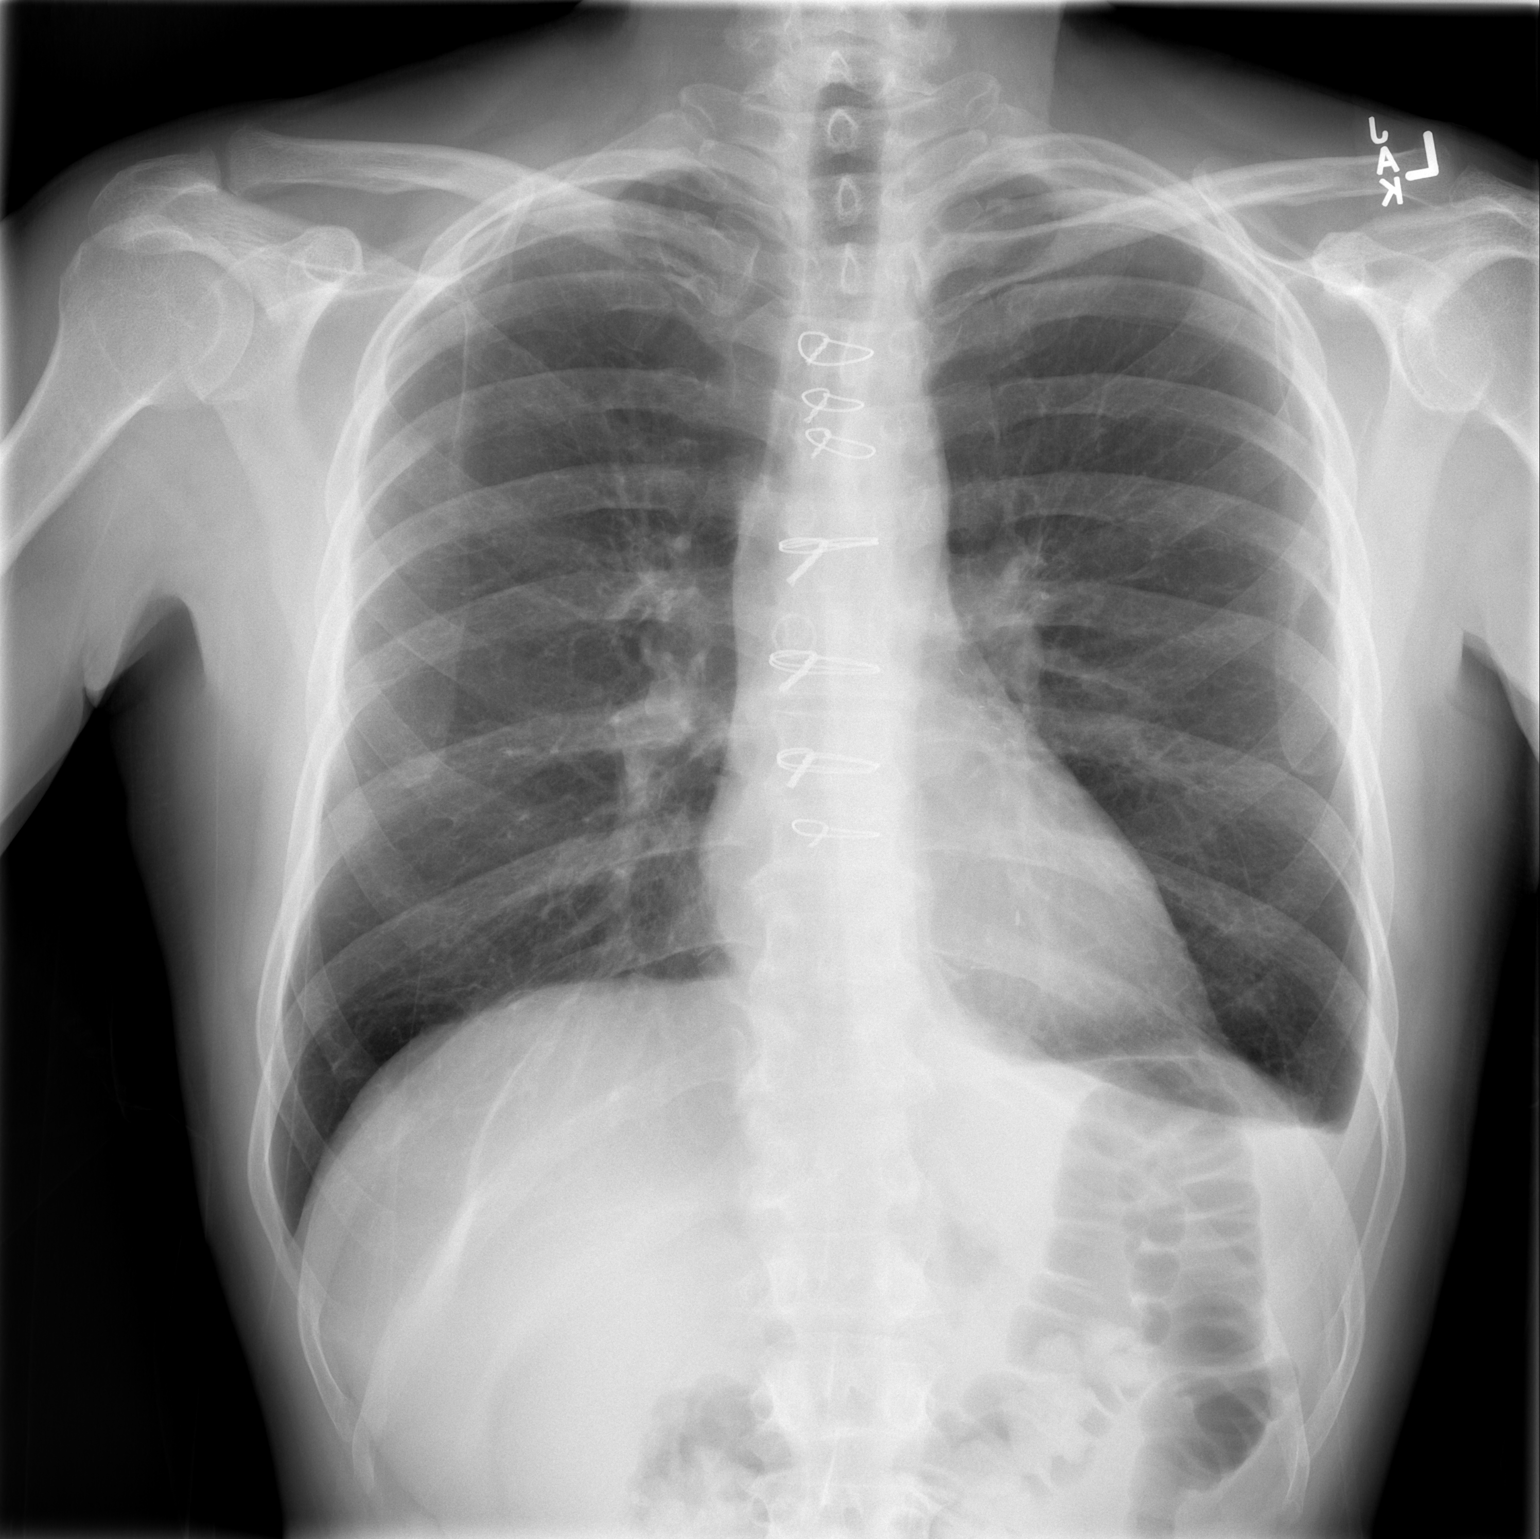

[w chest lat]
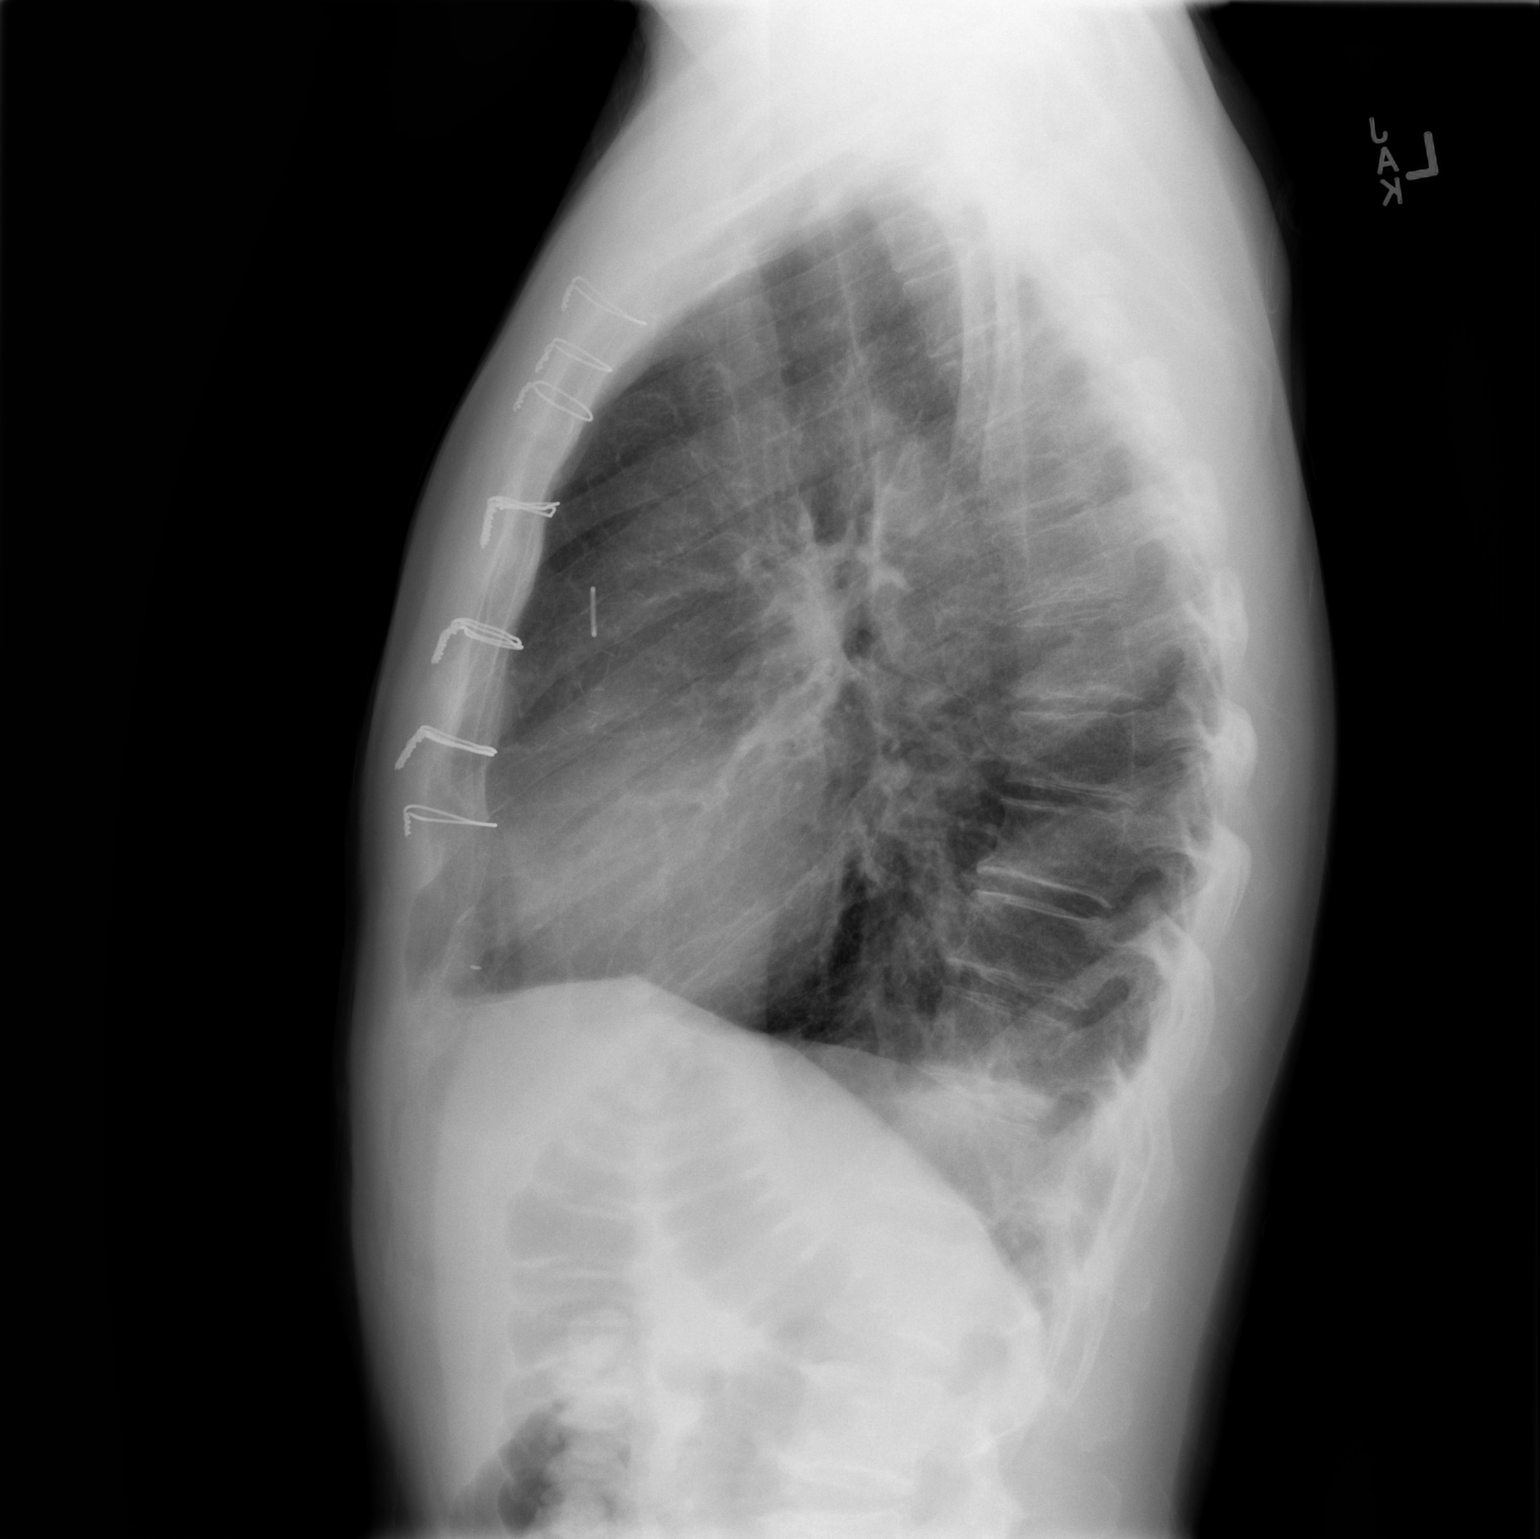

[2 of 2 positions shown; findings below may reference images not displayed]

FINDINGS: There has been further interval decrease in the volume of the
pleural effusion on the left. There is no right pleural effusion.
There is no pneumothorax. The heart and pulmonary vascularity are
normal. There is no interstitial edema. The mediastinum is normal in
width. The sternal wires are intact. The retrosternal soft tissues
are normal.
IMPRESSION: Further interval improvement in the appearance of the chest with
resolution of interstitial edema and a right pleural effusion and
interval decrease in the volume of the left pleural effusion.

## 2018-07-08 ENCOUNTER — Other Ambulatory Visit: Payer: Self-pay | Admitting: Cardiology

## 2019-09-24 ENCOUNTER — Other Ambulatory Visit: Payer: Self-pay | Admitting: Cardiology
